# Patient Record
Sex: Female | Born: 1945 | Race: White | Hispanic: No | Marital: Married | Smoking: Former smoker
Health system: Southern US, Community
[De-identification: ages and names within clinical notes are randomized; demographics above are authoritative.]

## PROBLEM LIST (undated history)

## (undated) DIAGNOSIS — K219 Gastro-esophageal reflux disease without esophagitis: Secondary | ICD-10-CM

## (undated) DIAGNOSIS — M1990 Unspecified osteoarthritis, unspecified site: Secondary | ICD-10-CM

## (undated) DIAGNOSIS — C801 Malignant (primary) neoplasm, unspecified: Secondary | ICD-10-CM

## (undated) DIAGNOSIS — E039 Hypothyroidism, unspecified: Secondary | ICD-10-CM

## (undated) HISTORY — PX: TONSILLECTOMY: SUR1361

## (undated) HISTORY — PX: ABDOMINAL HYSTERECTOMY: SHX81

---

## 1997-01-12 HISTORY — PX: BREAST LUMPECTOMY: SHX2

## 1997-11-21 ENCOUNTER — Other Ambulatory Visit: Admission: RE | Admit: 1997-11-21 | Discharge: 1997-11-21 | Payer: Self-pay | Admitting: *Deleted

## 1997-11-28 ENCOUNTER — Ambulatory Visit (HOSPITAL_COMMUNITY): Admission: RE | Admit: 1997-11-28 | Discharge: 1997-11-28 | Payer: Self-pay | Admitting: *Deleted

## 1997-12-14 ENCOUNTER — Encounter: Admission: RE | Admit: 1997-12-14 | Discharge: 1998-03-14 | Payer: Self-pay | Admitting: Radiation Oncology

## 1997-12-18 ENCOUNTER — Ambulatory Visit (HOSPITAL_COMMUNITY): Admission: RE | Admit: 1997-12-18 | Discharge: 1997-12-18 | Payer: Self-pay | Admitting: *Deleted

## 1998-01-01 ENCOUNTER — Ambulatory Visit (HOSPITAL_BASED_OUTPATIENT_CLINIC_OR_DEPARTMENT_OTHER): Admission: RE | Admit: 1998-01-01 | Discharge: 1998-01-01 | Payer: Self-pay | Admitting: *Deleted

## 1998-01-02 ENCOUNTER — Ambulatory Visit (HOSPITAL_COMMUNITY): Admission: RE | Admit: 1998-01-02 | Discharge: 1998-01-02 | Payer: Self-pay | Admitting: Hematology & Oncology

## 1998-01-02 ENCOUNTER — Encounter: Payer: Self-pay | Admitting: Hematology & Oncology

## 1998-01-03 ENCOUNTER — Other Ambulatory Visit: Admission: RE | Admit: 1998-01-03 | Discharge: 1998-01-03 | Payer: Self-pay | Admitting: *Deleted

## 1998-04-15 ENCOUNTER — Encounter: Admission: RE | Admit: 1998-04-15 | Discharge: 1998-07-14 | Payer: Self-pay | Admitting: Radiation Oncology

## 1998-06-21 ENCOUNTER — Ambulatory Visit (HOSPITAL_COMMUNITY): Admission: RE | Admit: 1998-06-21 | Discharge: 1998-06-21 | Payer: Self-pay | Admitting: Hematology & Oncology

## 1998-06-21 ENCOUNTER — Encounter: Payer: Self-pay | Admitting: Hematology & Oncology

## 1998-11-27 ENCOUNTER — Other Ambulatory Visit: Admission: RE | Admit: 1998-11-27 | Discharge: 1998-11-27 | Payer: Self-pay | Admitting: *Deleted

## 1998-11-28 ENCOUNTER — Other Ambulatory Visit: Admission: RE | Admit: 1998-11-28 | Discharge: 1998-11-28 | Payer: Self-pay | Admitting: *Deleted

## 1998-11-28 ENCOUNTER — Encounter (INDEPENDENT_AMBULATORY_CARE_PROVIDER_SITE_OTHER): Payer: Self-pay

## 1999-11-26 ENCOUNTER — Other Ambulatory Visit: Admission: RE | Admit: 1999-11-26 | Discharge: 1999-11-26 | Payer: Self-pay | Admitting: *Deleted

## 2000-11-12 ENCOUNTER — Ambulatory Visit (HOSPITAL_COMMUNITY): Admission: RE | Admit: 2000-11-12 | Discharge: 2000-11-12 | Payer: Self-pay | Admitting: Internal Medicine

## 2000-11-29 ENCOUNTER — Other Ambulatory Visit: Admission: RE | Admit: 2000-11-29 | Discharge: 2000-11-29 | Payer: Self-pay | Admitting: *Deleted

## 2000-11-29 ENCOUNTER — Ambulatory Visit (HOSPITAL_COMMUNITY): Admission: RE | Admit: 2000-11-29 | Discharge: 2000-11-29 | Payer: Self-pay | Admitting: *Deleted

## 2000-12-22 ENCOUNTER — Ambulatory Visit (HOSPITAL_COMMUNITY): Admission: RE | Admit: 2000-12-22 | Discharge: 2000-12-22 | Payer: Self-pay | Admitting: Family Medicine

## 2000-12-22 ENCOUNTER — Encounter: Payer: Self-pay | Admitting: Family Medicine

## 2001-01-18 ENCOUNTER — Ambulatory Visit (HOSPITAL_COMMUNITY): Admission: RE | Admit: 2001-01-18 | Discharge: 2001-01-18 | Payer: Self-pay | Admitting: Internal Medicine

## 2001-01-26 ENCOUNTER — Ambulatory Visit (HOSPITAL_COMMUNITY): Admission: RE | Admit: 2001-01-26 | Discharge: 2001-01-26 | Payer: Self-pay | Admitting: Internal Medicine

## 2001-12-07 ENCOUNTER — Ambulatory Visit (HOSPITAL_COMMUNITY): Admission: RE | Admit: 2001-12-07 | Discharge: 2001-12-07 | Payer: Self-pay | Admitting: *Deleted

## 2001-12-12 ENCOUNTER — Other Ambulatory Visit: Admission: RE | Admit: 2001-12-12 | Discharge: 2001-12-12 | Payer: Self-pay | Admitting: *Deleted

## 2002-12-13 ENCOUNTER — Ambulatory Visit (HOSPITAL_COMMUNITY): Admission: RE | Admit: 2002-12-13 | Discharge: 2002-12-13 | Payer: Self-pay | Admitting: *Deleted

## 2002-12-14 ENCOUNTER — Other Ambulatory Visit: Admission: RE | Admit: 2002-12-14 | Discharge: 2002-12-14 | Payer: Self-pay | Admitting: *Deleted

## 2004-01-09 ENCOUNTER — Ambulatory Visit (HOSPITAL_COMMUNITY): Admission: RE | Admit: 2004-01-09 | Discharge: 2004-01-09 | Payer: Self-pay | Admitting: *Deleted

## 2004-06-03 ENCOUNTER — Ambulatory Visit: Payer: Self-pay | Admitting: Hematology & Oncology

## 2005-01-14 ENCOUNTER — Ambulatory Visit (HOSPITAL_COMMUNITY): Admission: RE | Admit: 2005-01-14 | Discharge: 2005-01-14 | Payer: Self-pay | Admitting: *Deleted

## 2005-01-28 ENCOUNTER — Ambulatory Visit (HOSPITAL_COMMUNITY): Admission: RE | Admit: 2005-01-28 | Discharge: 2005-01-28 | Payer: Self-pay | Admitting: *Deleted

## 2005-04-02 ENCOUNTER — Encounter: Admission: RE | Admit: 2005-04-02 | Discharge: 2005-04-02 | Payer: Self-pay | Admitting: *Deleted

## 2005-04-03 ENCOUNTER — Encounter (INDEPENDENT_AMBULATORY_CARE_PROVIDER_SITE_OTHER): Payer: Self-pay | Admitting: Specialist

## 2005-04-03 ENCOUNTER — Ambulatory Visit (HOSPITAL_BASED_OUTPATIENT_CLINIC_OR_DEPARTMENT_OTHER): Admission: RE | Admit: 2005-04-03 | Discharge: 2005-04-03 | Payer: Self-pay | Admitting: *Deleted

## 2005-06-01 ENCOUNTER — Ambulatory Visit: Payer: Self-pay | Admitting: Hematology & Oncology

## 2005-06-03 LAB — CBC WITH DIFFERENTIAL/PLATELET
BASO%: 0.1 % (ref 0.0–2.0)
HCT: 45.7 % (ref 34.8–46.6)
HGB: 15.8 g/dL (ref 11.6–15.9)
LYMPH%: 31.6 % (ref 14.0–48.0)
MCHC: 34.5 g/dL (ref 32.0–36.0)
MCV: 90.2 fL (ref 81.0–101.0)
MONO%: 7.5 % (ref 0.0–13.0)
NEUT#: 4.7 10*3/uL (ref 1.5–6.5)
NEUT%: 58.8 % (ref 39.6–76.8)
Platelets: 337 10*3/uL (ref 145–400)
WBC: 8 10*3/uL (ref 3.9–10.0)

## 2005-06-03 LAB — COMPREHENSIVE METABOLIC PANEL
Albumin: 4.4 g/dL (ref 3.5–5.2)
Calcium: 9.2 mg/dL (ref 8.4–10.5)
Chloride: 103 mEq/L (ref 96–112)
Creatinine, Ser: 0.7 mg/dL (ref 0.4–1.2)
Glucose, Bld: 95 mg/dL (ref 70–99)
Potassium: 4.4 mEq/L (ref 3.5–5.3)
Sodium: 138 mEq/L (ref 135–145)

## 2005-12-15 ENCOUNTER — Ambulatory Visit (HOSPITAL_COMMUNITY): Admission: RE | Admit: 2005-12-15 | Discharge: 2005-12-16 | Payer: Self-pay | Admitting: *Deleted

## 2005-12-15 ENCOUNTER — Encounter (INDEPENDENT_AMBULATORY_CARE_PROVIDER_SITE_OTHER): Payer: Self-pay | Admitting: *Deleted

## 2006-01-18 ENCOUNTER — Ambulatory Visit (HOSPITAL_COMMUNITY): Admission: RE | Admit: 2006-01-18 | Discharge: 2006-01-18 | Payer: Self-pay | Admitting: *Deleted

## 2006-05-17 ENCOUNTER — Ambulatory Visit: Payer: Self-pay | Admitting: Hematology & Oncology

## 2006-05-19 LAB — COMPREHENSIVE METABOLIC PANEL
Albumin: 4.3 g/dL (ref 3.5–5.2)
BUN: 14 mg/dL (ref 6–23)
CO2: 24 mEq/L (ref 19–32)
Calcium: 8.7 mg/dL (ref 8.4–10.5)
Chloride: 101 mEq/L (ref 96–112)

## 2006-05-19 LAB — CBC WITH DIFFERENTIAL/PLATELET
EOS%: 2.7 % (ref 0.0–7.0)
HCT: 43.2 % (ref 34.8–46.6)
LYMPH%: 29.4 % (ref 14.0–48.0)
MCH: 30.3 pg (ref 26.0–34.0)
MCHC: 34.2 g/dL (ref 32.0–36.0)
MCV: 88.7 fL (ref 81.0–101.0)
MONO%: 6.5 % (ref 0.0–13.0)
NEUT#: 5.7 10*3/uL (ref 1.5–6.5)
NEUT%: 61 % (ref 39.6–76.8)
Platelets: 293 10*3/uL (ref 145–400)
RBC: 4.87 10*6/uL (ref 3.70–5.32)
RDW: 13.1 % (ref 11.3–14.5)

## 2007-03-10 ENCOUNTER — Ambulatory Visit (HOSPITAL_COMMUNITY): Admission: RE | Admit: 2007-03-10 | Discharge: 2007-03-10 | Payer: Self-pay | Admitting: Obstetrics and Gynecology

## 2007-06-02 ENCOUNTER — Ambulatory Visit: Payer: Self-pay | Admitting: Hematology & Oncology

## 2008-03-12 ENCOUNTER — Ambulatory Visit (HOSPITAL_COMMUNITY): Admission: RE | Admit: 2008-03-12 | Discharge: 2008-03-12 | Payer: Self-pay | Admitting: Obstetrics and Gynecology

## 2008-06-04 ENCOUNTER — Ambulatory Visit: Payer: Self-pay | Admitting: Hematology & Oncology

## 2008-06-06 ENCOUNTER — Ambulatory Visit: Payer: Self-pay | Admitting: Diagnostic Radiology

## 2008-06-06 ENCOUNTER — Ambulatory Visit (HOSPITAL_BASED_OUTPATIENT_CLINIC_OR_DEPARTMENT_OTHER): Admission: RE | Admit: 2008-06-06 | Discharge: 2008-06-06 | Payer: Self-pay | Admitting: Hematology & Oncology

## 2008-06-06 LAB — CBC WITH DIFFERENTIAL (CANCER CENTER ONLY)
BASO%: 0.7 % (ref 0.0–2.0)
Eosinophils Absolute: 0.2 10*3/uL (ref 0.0–0.5)
HCT: 45.4 % (ref 34.8–46.6)
LYMPH#: 3.3 10*3/uL (ref 0.9–3.3)
LYMPH%: 41 % (ref 14.0–48.0)
MCH: 30.2 pg (ref 26.0–34.0)
MCHC: 33.8 g/dL (ref 32.0–36.0)
MONO%: 7.2 % (ref 0.0–13.0)
NEUT%: 48.6 % (ref 39.6–80.0)
Platelets: 333 10*3/uL (ref 145–400)
RBC: 5.08 10*6/uL (ref 3.70–5.32)
RDW: 11.8 % (ref 10.5–14.6)

## 2008-06-06 LAB — COMPREHENSIVE METABOLIC PANEL
AST: 19 U/L (ref 0–37)
Albumin: 4.5 g/dL (ref 3.5–5.2)
Alkaline Phosphatase: 111 U/L (ref 39–117)
BUN: 11 mg/dL (ref 6–23)
CO2: 23 mEq/L (ref 19–32)
Chloride: 105 mEq/L (ref 96–112)
Glucose, Bld: 80 mg/dL (ref 70–99)
Total Bilirubin: 0.4 mg/dL (ref 0.3–1.2)
Total Protein: 7.5 g/dL (ref 6.0–8.3)

## 2008-12-12 ENCOUNTER — Ambulatory Visit (HOSPITAL_COMMUNITY): Admission: RE | Admit: 2008-12-12 | Discharge: 2008-12-12 | Payer: Self-pay | Admitting: Family Medicine

## 2009-03-14 ENCOUNTER — Ambulatory Visit (HOSPITAL_COMMUNITY): Admission: RE | Admit: 2009-03-14 | Discharge: 2009-03-14 | Payer: Self-pay | Admitting: Obstetrics and Gynecology

## 2009-05-28 ENCOUNTER — Ambulatory Visit: Payer: Self-pay | Admitting: Hematology & Oncology

## 2009-05-29 LAB — CBC WITH DIFFERENTIAL (CANCER CENTER ONLY)
BASO%: 0.8 % (ref 0.0–2.0)
EOS%: 2.7 % (ref 0.0–7.0)
HGB: 14.6 g/dL (ref 11.6–15.9)
MCH: 29.9 pg (ref 26.0–34.0)
MCV: 90 fL (ref 81–101)
NEUT#: 5.4 10*3/uL (ref 1.5–6.5)
NEUT%: 61.1 % (ref 39.6–80.0)
RBC: 4.88 10*6/uL (ref 3.70–5.32)

## 2009-05-30 LAB — COMPREHENSIVE METABOLIC PANEL
ALT: 18 U/L (ref 0–35)
Albumin: 4.1 g/dL (ref 3.5–5.2)
Alkaline Phosphatase: 108 U/L (ref 39–117)
CO2: 28 mEq/L (ref 19–32)
Glucose, Bld: 80 mg/dL (ref 70–99)

## 2009-05-30 LAB — VITAMIN D 25 HYDROXY (VIT D DEFICIENCY, FRACTURES): Vit D, 25-Hydroxy: 62 ng/mL (ref 30–89)

## 2009-08-30 ENCOUNTER — Encounter (HOSPITAL_COMMUNITY): Admission: RE | Admit: 2009-08-30 | Discharge: 2009-09-29 | Payer: Self-pay | Admitting: Orthopedic Surgery

## 2010-02-02 ENCOUNTER — Encounter: Payer: Self-pay | Admitting: Obstetrics and Gynecology

## 2010-03-10 ENCOUNTER — Other Ambulatory Visit (HOSPITAL_COMMUNITY): Payer: Self-pay | Admitting: Internal Medicine

## 2010-03-10 DIAGNOSIS — Z139 Encounter for screening, unspecified: Secondary | ICD-10-CM

## 2010-03-17 ENCOUNTER — Ambulatory Visit (HOSPITAL_COMMUNITY)
Admission: RE | Admit: 2010-03-17 | Discharge: 2010-03-17 | Disposition: A | Discharge status: Home / Self Care | Payer: Medicare Other | Source: Ambulatory Visit | Attending: Internal Medicine | Admitting: Internal Medicine

## 2010-03-17 DIAGNOSIS — Z139 Encounter for screening, unspecified: Secondary | ICD-10-CM

## 2010-03-17 DIAGNOSIS — Z1231 Encounter for screening mammogram for malignant neoplasm of breast: Secondary | ICD-10-CM | POA: Insufficient documentation

## 2010-05-30 NOTE — Discharge Summary (Signed)
NAMEMORISSA, OBEIRNE             ACCOUNT NO.:  [id number]   MEDICAL RECORD NO.:  [id number]          PATIENT TYPE:  INP   LOCATION:  9316                          FACILITY:  [facility]   PHYSICIAN:  Richardean Sale, M.D.   DATE OF BIRTH:  [birth year]   DATE OF ADMISSION:  12/15/2005  DATE OF DISCHARGE:  12/16/2005                               DISCHARGE SUMMARY   ADMITTING DIAGNOSES:  Complex endometrial hyperplasia.  Symptomatic  cystocele and rectocele -- for hysterectomy.   DISCHARGE DIAGNOSES:  Complex endometrial hyperplasia.  Symptomatic  cystocele and rectocele -- for hysterectomy.   PROCEDURES:  Laparoscopic-assisted vaginal hysterectomy, bilateral  salpingo-oophorectomy, pelvic washings and anterior-posterior repair  performed on December 16, 2005.   OPERATIVE FINDINGS:  Normal-appearing uterus, tubes and ovaries.  Evidence of past bilateral tubal ligation.  Normal-appearing appendix.  Normal liver and gallbladder.  Normal-appearing cervix.  Second-degree  cystocele and third-degree rectocele.   PATHOLOGY:  Cervix showed chronic cervicitis with squamous metaplasia.  Endometrium showed focal complex atypical hyperplasia, with no  malignancy identified.  Benign-appearing ovaries and tubes.  Pelvic  washings negative for malignancy.   LABORATORY STUDIES:  Hemoglobin 12.4, hematocrit 35.8, platelet count  234.   HOSPITAL COURSE:   HISTORY OF PRESENT ILLNESS:  Please see admission history and physical  for details.  Briefly, this is a 65 year old gravida 2, para 2 white  female who was found to have complex atypical endometrial hyperplasia on  D&C.  The patient also complains of symptomatic cystocele and rectocele.  She was admitted on December 15, 2005 and underwent an uncomplicated  laparoscopic-assisted vaginal hysterectomy and bilateral salpingo-  oophorectomy, pelvic washings and anterior-posterior repair.   Postoperatively the patient did well.  She had a low-grade fever  on the  morning of postoperative day #1 of 100.7, which resolved. Pain was  controlled with oral pain medications.  She was tolerating a regular  diet.  She was ambulating and voiding without difficulties and she was  subsequently discharged to home on postoperative day #1 in good  condition.   DISPOSITION:  To home.   CONDITION:  Improved.   MEDICATIONS:  1. Percocet 1-2 tablets p.o. q. 4-6 h. p.r.n. pain.  2. Ibuprofen as needed for pain.  3. Resume home medications.   INSTRUCTIONS:  The patient is to call for fever greater than 100.5,  heavy vaginal bleeding, pain not controlled with pain medications or any  other questions.   FOLLOWUP:  The patient will follow up in 4 weeks for routine  postoperative visit      Richardean Sale, M.D.  Electronically Signed     JW/MEDQ  D:  01/08/2006  T:  01/08/2006  Job:  [id number]

## 2010-05-30 NOTE — Op Note (Signed)
NAMEMCKENSI, REDINGER             ACCOUNT NO.:  [id number]   MEDICAL RECORD NO.:  [id number]          PATIENT TYPE:  INP   LOCATION:  9316                          FACILITY:  [facility]   PHYSICIAN:  Richardean Sale, M.D.   DATE OF BIRTH:  [birth year]   DATE OF PROCEDURE:  12/15/2005  DATE OF DISCHARGE:                               OPERATIVE REPORT   PREOPERATIVE DIAGNOSIS:  Complex endometrial hyperplasia with atypia,  uterine prolapse, symptomatic cystocele and rectocele.   POSTOPERATIVE DIAGNOSIS:  Complex endometrial hyperplasia with atypia,  uterine prolapse, symptomatic cystocele and rectocele.   PROCEDURE:  Laparoscopic assisted vaginal hysterectomy with bilateral  salpingo-oophorectomy, anterior and posterior tear, perineorrhaphy and  pelvic washings.   SURGEON:  Richardean Sale, M.D.   ANESTHESIA:  General.   COMPLICATIONS:  None.   ESTIMATED BLOOD LOSS:  150 mL.   URINE OUTPUT:  550 mL clear.   OPERATIVE FINDINGS:  Normal appearing uterus, tubes and ovaries.  Evidence of past bilateral tubal ligation.  Normal appearing appendix.  Normal appearing liver and gallbladder. Normal appearing cervix. Second  degree cystocele and a third degree rectocele.   SPECIMENS:  Uterus, cervix, fallopian tubes, ovaries and pelvic washings  pathology.   INDICATIONS:  This is a 65 year old white female, gravida 2, para 2, who  underwent a D&C for post menopausal bleeding and was found to have  complex atypical endometrial hyperplasia. The patient presents today for  definitive management with hysterectomy.  In addition, she has  symptomatic uterine prolapse as well as cystocele and rectocele and also  presents for anterior and posterior repair. Prior to the procedure, the  risks, benefits, and alternatives of the procedure were discussed with  the patient in detail.  We discussed the risks, which include but are  not limited to, hemorrhage requiring transfusion, infection, injury to  the bowel, bladder, ureters or other organs which could require  additional surgery, either at the time of this procedure or in the  future.  I discussed the risks of DVT, pulmonary embolus, and other  anesthesia related risks.  The voiced understanding of all the above and  desires to proceed.  Informed was obtained.  All questions were  answered.   DESCRIPTION OF PROCEDURE:  The patient was taken to the operating room  where she was given a general anesthetic.  She was then prepped and  draped in the usual sterile fashion with Betadine and placed in the  dorsal lithotomy position.  A Foley catheter was placed.  A speculum was  placed in the vagina and the cervix was grasped with a single tooth  tenaculum.  The Hulka tenaculum and introduced and the single tooth  tenaculum was removed.  The speculum was removed and attention was then  turned to the patient's abdomen.  6 mL of 0.25% plain Marcaine were  injected into the area where the incision was to be made and a 10 mm  infraumbilical skin incision was made with the scalpel.  This was  carried down to the fascia.  The fascia was then grasped with Kocher  clamps, elevated, and the fascia was  incised with Mayo scissors.  The  peritoneum was identified bluntly.  There was no evidence of any intra-  abdominal adhesions.  The fascial incision was secured with a  pursestring Vicryl suture and the Hassan trocar and sleeve were then  introduced. The camera was then introduced and confirmed intra-abdominal  placement. CO2 gas was then allowed to insufflate.  Two additional ports  were made in the bilateral lower quadrants, again, by injecting 0.25%  Marcaine, approximately 2 mL on each side, then making an incision with  the scalpel and, through this, the 5 mm ports were introduced under  direct visualization.   At this point, pelvic washings were obtained and sent to pathology.  The  pelvis was inspected with the findings noted above.  The  round ligaments  on both sides were then grasped the gyrus cautery, were cauterized,  transected, and were hemostatic.  The infundibulopelvic ligaments on  both sides were identified.  The ureters were identified coursing well  beneath the infundibulopelvic ligament on both sides.  The  infundibulopelvic ligaments were then cauterized and transected with  good hemostasis. The broad ligament was then separated into the anterior  and posterior leaves and the broad ligament was serial grasped,  cauterized and transected, with good hemostasis, down to the level of  the uterine arteries. The bladder flap was then created using the endo-  shears.  The bladder was then displaced inferiorly off of the lower  uterine segment and cervix.  Any areas of bleeding were cauterized. At  this point, all pedicles were inspected and were hemostatic. Attention  was then turned to the vaginal portion of the case.  The instruments  were removed from the patient's abdomen but the trocars remained in  place.   At this point, the Hulka tenaculum was removed and a posterior weighted  speculum was placed in vagina.  The cervix was then grasped with two  Christella Hartigan tenaculum. Approximately 20 mL of dilute vasopressin was then  injected circumferentially around cervix and the Bovie was used then to  make a circumferential incision around the cervix.  The anterior cul-de-  sac was then entered sharply and a beaver retractor was placed in the  anterior cul-de-sac to elevate the bladder.  The posterior cul-de-sac  was also entered sharply without difficulty.  The long narrow weighted  speculum was then inserted in the posterior cul-de-sac. The uterosacral  ligaments on both sides were then clamped, transected, and suture line  with Vicryl suture.  The cardinal ligaments on both sides were then  clamped, transected and suture ligated with Vicryl sutures with excellent hemostasis.  The LigaSure was then used to serially  cauterize  the uterine arteries.  These were then transected with good hemostasis.  Any additional broad ligament and peritoneal surfaces were then clamped  with the LigaSure, cauterized, and transected with good hemostasis.  At  this point, the uterus, fallopian tubes, ovaries, and cervix were  removed and sent to pathology.  The vaginal cuff was then carefully  inspected.   Attention was then turned to the cystocele. An additional 5 mL of  diluted vasopressin were then injected along the anterior vaginal wall.  The vaginal mucosa was then dissected off the bladder using Metzenbaum  scissors.  The edges of the vaginal mucosa were then grasped with Allis  clamps and placed on tension. The cystocele was then reduced and the  pubovesical cervical fascia was identified.  Any areas of bleeding were  cauterized with the  Bovie.  The cystocele was reduced by placing  interrupted 2-0 Vicryl sutures.  The vaginal mucosa was then turned on  both sides and the vaginal mucosa was reapproximated in the midline  using interrupted figure-of-eight Vicryl suture.  At this point, the  anterior portion of the vaginal cuff was closed down to the level of the  uterosacral ligaments using interrupted figure-of-eight Vicryl suture.  The posterior vaginal cuff was noted to be bleeding.  Therefore, the  posterior cuff was made hemostatic with a running locked Vicryl suture.  The edge of the rectum was very close to the posterior vaginal cuff and  great care was taken to make sure that the rectum was not entered.  When  the posterior cuff was hemostatic, the uterosacral ligaments were then  plicated together in the midline with good elevation of the cuff.  The  remaining posterior vaginal cuff was closed with interrupted figure-of-  eight Vicryl sutures and attention was turned the patient's rectocele.   At this point, the edges of the hymenal ring were grasped with Allis  clamps and a wedge shaped portion of  the vaginal mucosa and perineum was  then removed with a scalpel and discarded.  Dilute vasopressin was then  injected along the posterior vaginal wall.  The posterior vaginal mucosa  was then elevated and was dissected off of the rectum with Metzenbaum  scissors.  The edges of the vaginal cuff were grasped with Allis clamps  and placed on tension.  Using both sharp and blunt dissection, the  fascia was then identified. The cystocele was reduced, first by placing  a pursestring Vicryl suture and then by placing interrupted 2-0 Vicryl  sutures to reapproximate the fascia.  Any areas of bleeding were  cauterized with the Bovie.  The vaginal mucosa was then trimmed and the  vaginal mucosa was reapproximated using figure-of-eight Vicryl suture  and was hemostatic.  The perineorrhaphy was then performed. The perineal  body was re-supported with interrupted Vicryl suture and the vaginal mucosa and skin were then closed with subcuticular suture.  At this  point, the vagina was inspected.  There was at two fingerbreadths width  and the area was hemostatic.  The Foley was left in place.   At this point, attention was then turned back to the abdomen. Gloves  were changed.  The laparoscope was inserted and CO2 gas was allowed to  insufflate. The pelvis was then very carefully inspected. All pedicles  in the vaginal cuff were hemostatic.  Both ureters were seen and were  normal caliber and peristalsing normally on both sides.  The Nezhat was  then used to irrigate and suction any remaining fluid and the procedure  was then terminated.  All instruments were removed the patient's  abdomen.  The ports were removed under direct visualization.  CO2 gas  was allowed to escape.  The Roseanne Reno was removed.  The pursestring Vicryl  suture was tied to close the umbilical fascial incision.  The umbilical  incision was then closed with a 4-0 Vicryl subcuticular stitch and the  two 5 mm skin incisions were closed  with Dermabond.   The patient tolerated the procedure very well.  All sponge, lap, needle  and instrument counts were correct x2.  She was taken to recovery room  awake in stable condition.  There were no complications.      Richardean Sale, M.D.  Electronically Signed     JW/MEDQ  D:  12/15/2005  T:  12/15/2005  Job:  [id number]

## 2010-07-09 ENCOUNTER — Encounter (HOSPITAL_BASED_OUTPATIENT_CLINIC_OR_DEPARTMENT_OTHER): Payer: Medicare Other | Admitting: Hematology & Oncology

## 2010-07-09 ENCOUNTER — Other Ambulatory Visit: Payer: Self-pay | Admitting: Hematology & Oncology

## 2010-07-09 DIAGNOSIS — Z853 Personal history of malignant neoplasm of breast: Secondary | ICD-10-CM

## 2010-07-09 DIAGNOSIS — C50919 Malignant neoplasm of unspecified site of unspecified female breast: Secondary | ICD-10-CM

## 2010-07-09 DIAGNOSIS — D649 Anemia, unspecified: Secondary | ICD-10-CM

## 2010-07-09 LAB — COMPREHENSIVE METABOLIC PANEL
AST: 20 U/L (ref 0–37)
CO2: 26 mEq/L (ref 19–32)
Chloride: 103 mEq/L (ref 96–112)
Sodium: 140 mEq/L (ref 135–145)
Total Bilirubin: 0.3 mg/dL (ref 0.3–1.2)
Total Protein: 7.1 g/dL (ref 6.0–8.3)

## 2010-07-09 LAB — CBC WITH DIFFERENTIAL (CANCER CENTER ONLY)
BASO#: 0 10*3/uL (ref 0.0–0.2)
BASO%: 0.3 % (ref 0.0–2.0)
EOS%: 2.7 % (ref 0.0–7.0)
Eosinophils Absolute: 0.3 10*3/uL (ref 0.0–0.5)
LYMPH%: 36.4 % (ref 14.0–48.0)
MCH: 30.5 pg (ref 26.0–34.0)
MONO#: 0.9 10*3/uL (ref 0.1–0.9)
MONO%: 8.8 % (ref 0.0–13.0)
NEUT#: 5.2 10*3/uL (ref 1.5–6.5)
NEUT%: 51.8 % (ref 39.6–80.0)
RBC: 4.95 10*6/uL (ref 3.70–5.32)
RDW: 13.7 % (ref 11.1–15.7)

## 2010-12-12 ENCOUNTER — Telehealth: Payer: Self-pay | Admitting: *Deleted

## 2010-12-12 NOTE — Telephone Encounter (Signed)
Sent pt new schedule moved 6-27 to 6-26

## 2011-03-19 ENCOUNTER — Other Ambulatory Visit: Payer: Self-pay | Admitting: Hematology & Oncology

## 2011-03-19 DIAGNOSIS — Z139 Encounter for screening, unspecified: Secondary | ICD-10-CM

## 2011-03-23 ENCOUNTER — Ambulatory Visit (HOSPITAL_COMMUNITY)
Admission: RE | Admit: 2011-03-23 | Discharge: 2011-03-23 | Disposition: A | Discharge status: Home / Self Care | Payer: Medicare Other | Source: Ambulatory Visit | Attending: Hematology & Oncology | Admitting: Hematology & Oncology

## 2011-03-23 DIAGNOSIS — Z1231 Encounter for screening mammogram for malignant neoplasm of breast: Secondary | ICD-10-CM | POA: Insufficient documentation

## 2011-03-23 DIAGNOSIS — Z139 Encounter for screening, unspecified: Secondary | ICD-10-CM

## 2011-04-08 ENCOUNTER — Telehealth: Payer: Self-pay | Admitting: Hematology & Oncology

## 2011-04-08 NOTE — Telephone Encounter (Signed)
Pt called moved 6-26 to 7-24 wants to see MD only.

## 2011-07-08 ENCOUNTER — Ambulatory Visit: Payer: Medicare Other | Admitting: Hematology & Oncology

## 2011-07-08 ENCOUNTER — Ambulatory Visit: Payer: Medicare Other | Admitting: Family

## 2011-07-08 ENCOUNTER — Other Ambulatory Visit: Payer: Medicare Other | Admitting: Lab

## 2011-08-05 ENCOUNTER — Other Ambulatory Visit (HOSPITAL_BASED_OUTPATIENT_CLINIC_OR_DEPARTMENT_OTHER): Payer: Medicare Other | Admitting: Lab

## 2011-08-05 ENCOUNTER — Ambulatory Visit (HOSPITAL_BASED_OUTPATIENT_CLINIC_OR_DEPARTMENT_OTHER): Payer: Medicare Other | Admitting: Hematology & Oncology

## 2011-08-05 VITALS — BP 130/79 | HR 83 | Temp 97.4°F | Ht 63.0 in | Wt 168.0 lb

## 2011-08-05 DIAGNOSIS — C50919 Malignant neoplasm of unspecified site of unspecified female breast: Secondary | ICD-10-CM

## 2011-08-05 DIAGNOSIS — Z853 Personal history of malignant neoplasm of breast: Secondary | ICD-10-CM

## 2011-08-05 DIAGNOSIS — M810 Age-related osteoporosis without current pathological fracture: Secondary | ICD-10-CM

## 2011-08-05 LAB — CBC WITH DIFFERENTIAL (CANCER CENTER ONLY)
BASO%: 0.3 % (ref 0.0–2.0)
EOS%: 2.6 % (ref 0.0–7.0)
HCT: 44.9 % (ref 34.8–46.6)
LYMPH%: 32.7 % (ref 14.0–48.0)
MCH: 31.9 pg (ref 26.0–34.0)
MCHC: 34.5 g/dL (ref 32.0–36.0)
MCV: 92 fL (ref 81–101)
MONO%: 9.9 % (ref 0.0–13.0)
NEUT%: 54.5 % (ref 39.6–80.0)
Platelets: 264 10*3/uL (ref 145–400)
RDW: 14.1 % (ref 11.1–15.7)
WBC: 8 10*3/uL (ref 3.9–10.0)

## 2011-08-05 NOTE — Progress Notes (Signed)
This office note has been dictated.

## 2011-08-06 LAB — COMPREHENSIVE METABOLIC PANEL
ALT: 21 U/L (ref 0–35)
Alkaline Phosphatase: 93 U/L (ref 39–117)
CO2: 23 mEq/L (ref 19–32)
Creatinine, Ser: 0.6 mg/dL (ref 0.50–1.10)
Sodium: 137 mEq/L (ref 135–145)
Total Bilirubin: 0.4 mg/dL (ref 0.3–1.2)
Total Protein: 7.4 g/dL (ref 6.0–8.3)

## 2011-08-06 LAB — VITAMIN D 25 HYDROXY (VIT D DEFICIENCY, FRACTURES): Vit D, 25-Hydroxy: 84 ng/mL (ref 30–89)

## 2011-08-06 NOTE — Progress Notes (Signed)
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  CURRENT THERAPY:  Observation.  INTERIM HISTORY:  Lauren [name] comes in for her followup.  We see her yearly.  She is doing well.  She just got back from the [place], where they have a house.  There always enjoy down there.  She is enjoying her grandkids.  She has 3 grandkids.  They were with them for 3 weeks down in the [place].  Lauren [name] is ready for a break.  She has had no complaints.  She has had no cough or shortness of breath. There is no headache.  There is no nausea or vomiting.  There is no change in bowel or bladder habits.  She has had no rashes.  There has been no leg swelling.  Her last mammogram was done back in March.  The mammogram looked fine with no evidence of abnormalities or suspicious calcifications.  PHYSICAL EXAMINATION:  General:  This is a well-developed, well- nourished white female in no obvious distress.  Vital Signs:  Show a temperature of 97.4, pulse 83, respiratory rate 18, blood pressure is 130/79, weight is 168.  Head and Neck Exam:  Shows a normocephalic, atraumatic skull.  There are no ocular or oral lesions.  There are no palpable cervical or supraclavicular lymph nodes.  Lungs:  Clear bilaterally.  Cardiac Exam:  Regular rate and rhythm with a normal S1 and S2.  There are no murmurs, rubs, or bruits.  Breast Exam:  Shows the right breast with no masses, edema, or erythema.  There is no right axillary adenopathy.  Left breast shows a well-healed lumpectomy at the 2 o'clock position.  There is some slight firmness at the lumpectomy site.  No distinct mass noted in the left breast.  There is no left axillary adenopathy.  Abdominal Exam:  Soft with good bowel sounds. There is no palpable abdominal mass.  There is no fluid wave.  There is no palpable hepatosplenomegaly.  Back Exam:  No tenderness over the spine, ribs, or hips.  Extremities:   Show no clubbing, cyanosis, or edema.  Neurological Exam:  Shows no focal neurological deficits.  LABORATORY STUDIES:  White cell count is 8, hemoglobin 15.5, hematocrit 44.9, platelet count 264.  BUN is 18, creatinine 0.6.  Calcium 9.9 with an albumin of 4.6.  LFTs are normal.  IMPRESSION:  Lauren [name] is a 66 year old white female with a remote history of stage I lobular carcinoma of the left breast.  She underwent lumpectomy back in 1999.  She was on tamoxifen for 5 years.  We will see her back in 1 year.  She has really done very well.  I do believe that she is cured.    ______________________________ Josph Macho, M.D. PRE/MEDQ  D:  08/05/2011  T:  08/06/2011  Job:  [id number]

## 2011-08-13 ENCOUNTER — Telehealth: Payer: Self-pay | Admitting: *Deleted

## 2011-08-13 NOTE — Telephone Encounter (Signed)
Called patient to let her know that her labwork and vitamin D. Were great per dr. Myna Hidalgo

## 2011-08-13 NOTE — Telephone Encounter (Signed)
Message copied by Anselm Jungling on Thu Aug 13, 2011 10:29 AM ------      Message from: Arlan Organ R      Created: Mon Aug 10, 2011  5:28 PM       Call - labs and vit D are doing great!!!  Enjoy the [place]!!!  Cindee Lame

## 2012-03-17 ENCOUNTER — Other Ambulatory Visit: Payer: Self-pay | Admitting: Hematology & Oncology

## 2012-03-17 DIAGNOSIS — Z139 Encounter for screening, unspecified: Secondary | ICD-10-CM

## 2012-03-28 ENCOUNTER — Ambulatory Visit (HOSPITAL_COMMUNITY)
Admission: RE | Admit: 2012-03-28 | Discharge: 2012-03-28 | Disposition: A | Discharge status: Home / Self Care | Payer: Medicare Other | Source: Ambulatory Visit | Attending: Hematology & Oncology | Admitting: Hematology & Oncology

## 2012-03-28 DIAGNOSIS — Z1231 Encounter for screening mammogram for malignant neoplasm of breast: Secondary | ICD-10-CM | POA: Insufficient documentation

## 2012-08-04 ENCOUNTER — Ambulatory Visit (HOSPITAL_BASED_OUTPATIENT_CLINIC_OR_DEPARTMENT_OTHER)
Admission: RE | Admit: 2012-08-04 | Discharge: 2012-08-04 | Disposition: A | Discharge status: Home / Self Care | Payer: Medicare Other | Source: Ambulatory Visit | Attending: Hematology & Oncology | Admitting: Hematology & Oncology

## 2012-08-04 ENCOUNTER — Other Ambulatory Visit (HOSPITAL_BASED_OUTPATIENT_CLINIC_OR_DEPARTMENT_OTHER): Payer: Medicare Other | Admitting: Lab

## 2012-08-04 ENCOUNTER — Ambulatory Visit (HOSPITAL_BASED_OUTPATIENT_CLINIC_OR_DEPARTMENT_OTHER): Payer: Medicare Other | Admitting: Hematology & Oncology

## 2012-08-04 VITALS — BP 124/75 | HR 79 | Temp 98.2°F | Resp 16 | Ht 63.0 in | Wt 167.0 lb

## 2012-08-04 DIAGNOSIS — M810 Age-related osteoporosis without current pathological fracture: Secondary | ICD-10-CM

## 2012-08-04 DIAGNOSIS — C50919 Malignant neoplasm of unspecified site of unspecified female breast: Secondary | ICD-10-CM

## 2012-08-04 DIAGNOSIS — R059 Cough, unspecified: Secondary | ICD-10-CM | POA: Insufficient documentation

## 2012-08-04 DIAGNOSIS — R05 Cough: Secondary | ICD-10-CM | POA: Insufficient documentation

## 2012-08-04 DIAGNOSIS — D689 Coagulation defect, unspecified: Secondary | ICD-10-CM

## 2012-08-04 LAB — CBC WITH DIFFERENTIAL (CANCER CENTER ONLY)
BASO%: 0.6 % (ref 0.0–2.0)
EOS%: 3.6 % (ref 0.0–7.0)
HCT: 44.9 % (ref 34.8–46.6)
LYMPH#: 2.7 10*3/uL (ref 0.9–3.3)
LYMPH%: 37.6 % (ref 14.0–48.0)
MCHC: 33.2 g/dL (ref 32.0–36.0)
MCV: 92 fL (ref 81–101)
NEUT%: 46.4 % (ref 39.6–80.0)
RDW: 13.4 % (ref 11.1–15.7)

## 2012-08-04 NOTE — Progress Notes (Signed)
This office note has been dictated.

## 2012-08-05 ENCOUNTER — Telehealth: Payer: Self-pay | Admitting: *Deleted

## 2012-08-05 LAB — COMPREHENSIVE METABOLIC PANEL
ALT: 21 U/L (ref 0–35)
AST: 19 U/L (ref 0–37)
Alkaline Phosphatase: 103 U/L (ref 39–117)
Creatinine, Ser: 0.64 mg/dL (ref 0.50–1.10)
Total Bilirubin: 0.4 mg/dL (ref 0.3–1.2)

## 2012-08-05 NOTE — Telephone Encounter (Signed)
Called patient to let her know that her labs and chest xray were ok per dr. Myna Hidalgo

## 2012-08-05 NOTE — Progress Notes (Signed)
CC:   Lauren [name]. 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  CURRENT THERAPY:  Observation.  INTERIM HISTORY:  Lauren [name] comes in for followup.  She is doing quite well.  As always, she practically lives down in the [place].  She just got back this past weekend.  She will be heading back down in September.  She has had no problems this year.  She has had no broken bones.  She has had no nausea or vomiting.  There has been no change in bowel or bladder habits.  We will go and get a chest x-ray on her today.  Her last mammogram was done back in February.  Everything looked good on the mammogram.  Actually the mammogram was done in March.  PHYSICAL EXAMINATION:  General:  This is a well-developed, well- nourished white female in no obvious distress.  Vital signs: Temperature of 98.2, pulse 79, respiratory rate 16, blood pressure 124/75.  Weight is 167.  Head and neck:  Normocephalic, atraumatic skull.  There are no ocular or oral lesions.  There are no palpable cervical or supraclavicular lymph nodes.  Lungs:  Clear bilaterally. Cardiac:  Regular rate and rhythm with a normal S1 and S2.  There are no murmurs, rubs or bruits.  Breasts:  Shows right breast with no masses, edema or erythema.  There is no nipple discharge.  There is no right axillary adenopathy.  Left breast shows well-healed lumpectomy at the 2 o'clock position.  No masses noted in the left breast.  There is no left axillary lymphadenopathy.  Abdomen:  Soft.  She has good bowel sounds. There is no palpable abdominal mass.  No palpable hepatosplenomegaly. Extremities:  Show no clubbing, cyanosis or edema.  Skin:  Shows no rashes, ecchymosis, or petechia.  Neurological:  Shows no focal neurological deficits.  LABORATORY STUDIES:  White cell count is 7.3, hemoglobin 14.9, hematocrit 44.9, platelet count 275.  IMPRESSION:  Lauren [name] is a 67 year old white female with history  of lobular carcinoma of the left breast.  She had stage I disease.  She underwent lumpectomy and radiation therapy.  She completed 5 years of tamoxifen back in 2004.  We will continue to see her yearly.  We will get a chest x-ray on her today.    ______________________________ Lauren [name], M.D. PRE/MEDQ  D:  08/04/2012  T:  08/05/2012  Job:  [id number]

## 2012-08-05 NOTE — Telephone Encounter (Signed)
Message copied by Anselm Jungling on Fri Aug 05, 2012  5:02 PM ------      Message from: Arlan Organ R      Created: Thu Aug 04, 2012  9:23 PM       Call - labs and chest xray are ok!!  Cindee Lame ------

## 2013-03-15 ENCOUNTER — Other Ambulatory Visit (HOSPITAL_COMMUNITY): Payer: Self-pay | Admitting: Internal Medicine

## 2013-03-15 DIAGNOSIS — Z1231 Encounter for screening mammogram for malignant neoplasm of breast: Secondary | ICD-10-CM

## 2013-03-30 ENCOUNTER — Ambulatory Visit (HOSPITAL_COMMUNITY)
Admission: RE | Admit: 2013-03-30 | Discharge: 2013-03-30 | Disposition: A | Discharge status: Home / Self Care | Payer: Medicare Other | Source: Ambulatory Visit | Attending: Internal Medicine | Admitting: Internal Medicine

## 2013-03-30 DIAGNOSIS — Z1231 Encounter for screening mammogram for malignant neoplasm of breast: Secondary | ICD-10-CM | POA: Insufficient documentation

## 2013-06-12 ENCOUNTER — Telehealth: Payer: Self-pay | Admitting: Hematology & Oncology

## 2013-06-12 NOTE — Telephone Encounter (Signed)
Pt called moved 7-23 to 8-18

## 2013-08-03 ENCOUNTER — Other Ambulatory Visit: Payer: Medicare Other | Admitting: Lab

## 2013-08-03 ENCOUNTER — Ambulatory Visit: Payer: Medicare Other | Admitting: Hematology & Oncology

## 2013-08-28 ENCOUNTER — Other Ambulatory Visit: Payer: Self-pay

## 2013-08-28 DIAGNOSIS — Z853 Personal history of malignant neoplasm of breast: Secondary | ICD-10-CM

## 2013-08-29 ENCOUNTER — Other Ambulatory Visit (HOSPITAL_BASED_OUTPATIENT_CLINIC_OR_DEPARTMENT_OTHER): Payer: Medicare Other | Admitting: Lab

## 2013-08-29 ENCOUNTER — Ambulatory Visit (HOSPITAL_BASED_OUTPATIENT_CLINIC_OR_DEPARTMENT_OTHER): Payer: Medicare Other | Admitting: Hematology & Oncology

## 2013-08-29 VITALS — BP 126/82 | HR 84 | Temp 96.8°F | Resp 18 | Wt 160.0 lb

## 2013-08-29 DIAGNOSIS — C50912 Malignant neoplasm of unspecified site of left female breast: Secondary | ICD-10-CM

## 2013-08-29 DIAGNOSIS — Z853 Personal history of malignant neoplasm of breast: Secondary | ICD-10-CM

## 2013-08-29 LAB — CBC WITH DIFFERENTIAL (CANCER CENTER ONLY)
BASO#: 0 10*3/uL (ref 0.0–0.2)
BASO%: 0.4 % (ref 0.0–2.0)
EOS%: 4.7 % (ref 0.0–7.0)
Eosinophils Absolute: 0.3 10*3/uL (ref 0.0–0.5)
HEMATOCRIT: 43.5 % (ref 34.8–46.6)
HEMOGLOBIN: 14.6 g/dL (ref 11.6–15.9)
LYMPH#: 2 10*3/uL (ref 0.9–3.3)
LYMPH%: 28 % (ref 14.0–48.0)
MCH: 30.9 pg (ref 26.0–34.0)
MCHC: 33.6 g/dL (ref 32.0–36.0)
MCV: 92 fL (ref 81–101)
MONO#: 0.8 10*3/uL (ref 0.1–0.9)
MONO%: 11.1 % (ref 0.0–13.0)
NEUT#: 4 10*3/uL (ref 1.5–6.5)
NEUT%: 55.8 % (ref 39.6–80.0)
Platelets: 282 10*3/uL (ref 145–400)
RBC: 4.73 10*6/uL (ref 3.70–5.32)
RDW: 14.1 % (ref 11.1–15.7)
WBC: 7.2 10*3/uL (ref 3.9–10.0)

## 2013-08-30 LAB — COMPREHENSIVE METABOLIC PANEL
ALT: 18 U/L (ref 0–35)
AST: 17 U/L (ref 0–37)
Albumin: 4.3 g/dL (ref 3.5–5.2)
Alkaline Phosphatase: 115 U/L (ref 39–117)
BUN: 11 mg/dL (ref 6–23)
CALCIUM: 9 mg/dL (ref 8.4–10.5)
CHLORIDE: 102 meq/L (ref 96–112)
CO2: 27 mEq/L (ref 19–32)
CREATININE: 0.55 mg/dL (ref 0.50–1.10)
Glucose, Bld: 93 mg/dL (ref 70–99)
Potassium: 4.2 mEq/L (ref 3.5–5.3)
Sodium: 137 mEq/L (ref 135–145)
Total Bilirubin: 0.5 mg/dL (ref 0.2–1.2)
Total Protein: 7.3 g/dL (ref 6.0–8.3)

## 2013-08-30 LAB — VITAMIN D 25 HYDROXY (VIT D DEFICIENCY, FRACTURES): Vit D, 25-Hydroxy: 86 ng/mL (ref 30–89)

## 2013-08-30 NOTE — Progress Notes (Signed)
Hematology and Oncology Follow Up Visit  Lauren [name] [id number] May 24, 1945 68 y.o. 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  Current Therapy:   Observation     Interim History:  Ms.  Lauren [name] is back for followup peewee see her yearly. As always, she just got back from the [place].  She's been doing well this year. She's had no problems. She's had no change in medications. She's had no cough. She's had no nausea or vomiting his been no change in bowel or bladder habits. She's had no problems with weight loss weight gain.  She has occasional bouts of arthralgia.  Her last mammogram was back in March of this year. Everything looked fine..  Medications: Current outpatient prescriptions:aspirin 81 MG tablet, Take 81 mg by mouth daily., Disp: , Rfl: ;  Calcium-Vitamin D-Vitamin K (VIACTIV PO), Take by mouth. Only with vitamin D, Disp: , Rfl: ;  celecoxib (CELEBREX) 200 MG capsule, Take 200 mg by mouth 2 (two) times daily., Disp: , Rfl: ;  folic acid (FOLVITE) 0.5 MG tablet, Take 1 mg by mouth daily., Disp: , Rfl:  glucosamine-chondroitin 500-400 MG tablet, Take 1 tablet by mouth every morning., Disp: , Rfl: ;  levothyroxine (SYNTHROID) 150 MCG tablet, Take 150 mcg by mouth daily., Disp: , Rfl: ;  Magnesium 400 MG CAPS, Take by mouth every morning., Disp: , Rfl: ;  methotrexate (RHEUMATREX) 2.5 MG tablet, Take 2.5 mg by mouth once a week. Caution:Chemotherapy. Protect from light.  Takes 8 once a week., Disp: , Rfl:  pravastatin (PRAVACHOL) 10 MG tablet, 10 mg daily. , Disp: , Rfl:   Allergies: No Known Allergies  Past Medical History, Surgical history, Social history, and Family History were reviewed and updated.  Review of Systems: As above  Physical Exam:  weight is 160 lb (72.576 kg). Her temperature is 96.8 F (36 C). Her blood pressure is 126/82 and her pulse is 84. Her respiration is 18.   Well-developed and well-nourished white  female. Head and neck exam shows no ocular or oral lesions. There are no palpable cervical or supraclavicular lymph nodes. Lungs are clear. Cardiac exam regular in rhythm with no murmurs rubs or bruits. Breast exam shows right breast with no masses, edema or erythema. There is no right axillary adenopathy. Left breast shows a well-healed lumpectomy at the 2:00 position. No masses noted in the left breast. There is no erythema or swelling. There is no left nipple discharge. There is no left axillary lymphadenopathy. Abdomen is soft. She has good bowel sounds. There is no fluid wave. There is no palpable liver or spleen tip. Back exam shows no tenderness over the spine ribs or hips. Extremities shows no clubbing cyanosis or edema. Skin exam shows no suspicious rashes.  Lab Results  Component Value Date   WBC 7.2 08/29/2013   HGB 14.6 08/29/2013   HCT 43.5 08/29/2013   MCV 92 08/29/2013   PLT 282 08/29/2013     Chemistry      Component Value Date/Time   NA 137 08/29/2013 1034   K 4.2 08/29/2013 1034   CL 102 08/29/2013 1034   CO2 27 08/29/2013 1034   BUN 11 08/29/2013 1034   CREATININE 0.55 08/29/2013 1034      Component Value Date/Time   CALCIUM 9.0 08/29/2013 1034   ALKPHOS 115 08/29/2013 1034   AST 17 08/29/2013 1034   ALT 18 08/29/2013 1034   BILITOT 0.5 08/29/2013  1034         Impression and Plan: Ms. Lauren [name] is 68 year old white female with a past history of stage I infiltrating lobular carcinoma of the left breast. She underwent lumpectomy and radiation. She had tamoxifen. This was completed back in 2004.  We will continue to see her back yearly. She enjoys seeing [place] once a year.   Lauren Napoleon, MD 8/19/20156:14 PM

## 2013-08-31 ENCOUNTER — Telehealth: Payer: Self-pay | Admitting: *Deleted

## 2013-08-31 NOTE — Telephone Encounter (Signed)
Message copied by Rico Ala on Thu Aug 31, 2013 10:50 AM ------      Message from: Burney Gauze R      Created: Wed Aug 30, 2013  7:22 AM       Call - labs and vit D are ok.  pete ------

## 2013-12-25 ENCOUNTER — Other Ambulatory Visit (HOSPITAL_COMMUNITY): Payer: Self-pay | Admitting: Internal Medicine

## 2013-12-25 DIAGNOSIS — E039 Hypothyroidism, unspecified: Secondary | ICD-10-CM

## 2013-12-25 DIAGNOSIS — Z139 Encounter for screening, unspecified: Secondary | ICD-10-CM

## 2014-01-01 ENCOUNTER — Ambulatory Visit (HOSPITAL_COMMUNITY)
Admission: RE | Admit: 2014-01-01 | Discharge: 2014-01-01 | Disposition: A | Discharge status: Home / Self Care | Payer: Medicare Other | Source: Ambulatory Visit | Attending: Internal Medicine | Admitting: Internal Medicine

## 2014-01-01 DIAGNOSIS — Z13820 Encounter for screening for osteoporosis: Secondary | ICD-10-CM | POA: Insufficient documentation

## 2014-01-01 DIAGNOSIS — E039 Hypothyroidism, unspecified: Secondary | ICD-10-CM

## 2014-04-20 DIAGNOSIS — H04123 Dry eye syndrome of bilateral lacrimal glands: Secondary | ICD-10-CM | POA: Diagnosis not present

## 2014-04-20 DIAGNOSIS — H3589 Other specified retinal disorders: Secondary | ICD-10-CM | POA: Diagnosis not present

## 2014-04-20 DIAGNOSIS — H31092 Other chorioretinal scars, left eye: Secondary | ICD-10-CM | POA: Diagnosis not present

## 2014-04-20 DIAGNOSIS — H40013 Open angle with borderline findings, low risk, bilateral: Secondary | ICD-10-CM | POA: Diagnosis not present

## 2014-04-20 DIAGNOSIS — H2513 Age-related nuclear cataract, bilateral: Secondary | ICD-10-CM | POA: Diagnosis not present

## 2014-04-23 ENCOUNTER — Other Ambulatory Visit (HOSPITAL_COMMUNITY): Payer: Self-pay | Admitting: Internal Medicine

## 2014-04-23 DIAGNOSIS — L4050 Arthropathic psoriasis, unspecified: Secondary | ICD-10-CM | POA: Diagnosis not present

## 2014-04-23 DIAGNOSIS — L401 Generalized pustular psoriasis: Secondary | ICD-10-CM | POA: Diagnosis not present

## 2014-04-23 DIAGNOSIS — Z1231 Encounter for screening mammogram for malignant neoplasm of breast: Secondary | ICD-10-CM

## 2014-04-30 DIAGNOSIS — Z23 Encounter for immunization: Secondary | ICD-10-CM | POA: Diagnosis not present

## 2014-05-14 DIAGNOSIS — H2511 Age-related nuclear cataract, right eye: Secondary | ICD-10-CM | POA: Diagnosis not present

## 2014-05-21 ENCOUNTER — Ambulatory Visit (HOSPITAL_COMMUNITY)
Admission: RE | Admit: 2014-05-21 | Discharge: 2014-05-21 | Disposition: A | Discharge status: Home / Self Care | Payer: Medicare Other | Source: Ambulatory Visit | Attending: Internal Medicine | Admitting: Internal Medicine

## 2014-05-21 DIAGNOSIS — Z1231 Encounter for screening mammogram for malignant neoplasm of breast: Secondary | ICD-10-CM | POA: Insufficient documentation

## 2014-05-22 ENCOUNTER — Telehealth: Payer: Self-pay | Admitting: *Deleted

## 2014-05-22 DIAGNOSIS — H2512 Age-related nuclear cataract, left eye: Secondary | ICD-10-CM | POA: Diagnosis not present

## 2014-05-22 NOTE — Telephone Encounter (Signed)
Patient wants Dr Antonieta Pert opinion on her rheumatologist prescribing Humira. Dr Marin Olp has no concerns. Communicated this to patient.

## 2014-05-28 DIAGNOSIS — H2512 Age-related nuclear cataract, left eye: Secondary | ICD-10-CM | POA: Diagnosis not present

## 2014-06-08 DIAGNOSIS — L401 Generalized pustular psoriasis: Secondary | ICD-10-CM | POA: Diagnosis not present

## 2014-06-08 DIAGNOSIS — L4050 Arthropathic psoriasis, unspecified: Secondary | ICD-10-CM | POA: Diagnosis not present

## 2014-08-28 ENCOUNTER — Other Ambulatory Visit (HOSPITAL_BASED_OUTPATIENT_CLINIC_OR_DEPARTMENT_OTHER): Payer: Medicare Other

## 2014-08-28 ENCOUNTER — Ambulatory Visit (HOSPITAL_BASED_OUTPATIENT_CLINIC_OR_DEPARTMENT_OTHER): Payer: Medicare Other | Admitting: Hematology & Oncology

## 2014-08-28 ENCOUNTER — Encounter: Payer: Self-pay | Admitting: Hematology & Oncology

## 2014-08-28 VITALS — BP 125/74 | HR 82 | Temp 97.5°F | Resp 16 | Ht 63.0 in | Wt 165.0 lb

## 2014-08-28 DIAGNOSIS — C50912 Malignant neoplasm of unspecified site of left female breast: Secondary | ICD-10-CM | POA: Diagnosis not present

## 2014-08-28 DIAGNOSIS — Z853 Personal history of malignant neoplasm of breast: Secondary | ICD-10-CM | POA: Diagnosis not present

## 2014-08-28 DIAGNOSIS — D688 Other specified coagulation defects: Secondary | ICD-10-CM | POA: Diagnosis not present

## 2014-08-28 LAB — CBC WITH DIFFERENTIAL (CANCER CENTER ONLY)
BASO#: 0 10*3/uL (ref 0.0–0.2)
BASO%: 0.3 % (ref 0.0–2.0)
EOS ABS: 0.3 10*3/uL (ref 0.0–0.5)
EOS%: 3.5 % (ref 0.0–7.0)
HCT: 41 % (ref 34.8–46.6)
HEMOGLOBIN: 13.8 g/dL (ref 11.6–15.9)
LYMPH#: 1.8 10*3/uL (ref 0.9–3.3)
LYMPH%: 22.9 % (ref 14.0–48.0)
MCH: 31.2 pg (ref 26.0–34.0)
MCHC: 33.7 g/dL (ref 32.0–36.0)
MCV: 93 fL (ref 81–101)
MONO#: 1.1 10*3/uL — AB (ref 0.1–0.9)
MONO%: 14 % — AB (ref 0.0–13.0)
NEUT%: 59.3 % (ref 39.6–80.0)
NEUTROS ABS: 4.6 10*3/uL (ref 1.5–6.5)
PLATELETS: 266 10*3/uL (ref 145–400)
RBC: 4.42 10*6/uL (ref 3.70–5.32)
RDW: 14.2 % (ref 11.1–15.7)
WBC: 7.7 10*3/uL (ref 3.9–10.0)

## 2014-08-28 NOTE — Progress Notes (Signed)
Hematology and Oncology Follow Up Visit  Lauren [name] [id number] Apr 14, 1945 69 y.o. 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  Current Therapy:   Observation     Interim History:  Ms.  Lauren [name] is back for followup peewee see her yearly. As always, she just got back from the [place].  She's been doing well this year. She's had no problems. She's had no change in medications. She's had no cough. She's had no nausea or vomiting his been no change in bowel or bladder habits. She's had no problems with weight loss weight gain.  She has occasional bouts of arthralgia.  She does have some arthritis. She has psoriatic arthritis  Her last mammogram was back in  May of this year. Everything looked fine..   I told her to take extra viamin D. I toldher to ae 1000 units daly along with calcium  Medications:  Current outpatient prescriptions:  .  aspirin 81 MG tablet, Take 81 mg by mouth daily., Disp: , Rfl:  .  Calcium-Vitamin D-Vitamin K (VIACTIV PO), Take by mouth. Only with vitamin D, Disp: , Rfl:  .  celecoxib (CELEBREX) 200 MG capsule, Take 200 mg by mouth 2 (two) times daily., Disp: , Rfl:  .  folic acid (FOLVITE) 0.5 MG tablet, Take 1 mg by mouth daily., Disp: , Rfl:  .  glucosamine-chondroitin 500-400 MG tablet, Take 1 tablet by mouth every morning., Disp: , Rfl:  .  Magnesium 400 MG CAPS, Take by mouth every morning., Disp: , Rfl:  .  methotrexate (RHEUMATREX) 2.5 MG tablet, Take 250 mg by mouth once a week. Caution:Chemotherapy. Protect from light.  Takes 8 once a week., Disp: , Rfl:  .  pravastatin (PRAVACHOL) 10 MG tablet, 10 mg daily. , Disp: , Rfl:  .  SYNTHROID 137 MCG tablet, , Disp: , Rfl:   Allergies: No Known Allergies  Past Medical History, Surgical history, Social history, and Family History were reviewed and updated.  Review of Systems: As above  Physical Exam:  height is 5\' 3"  (1.6 m) and weight is 165 lb (74.844  kg). Her oral temperature is 97.5 F (36.4 C). Her blood pressure is 125/74 and her pulse is 82. Her respiration is 16.   Well-developed and well-nourished white female. Head and neck exam shows no ocular or oral lesions. There are no palpable cervical or supraclavicular lymph nodes. Lungs are clear. Cardiac exam regular rate andrhythm with no murmurs rubs or bruits. Breast exam shows right breast with no masses, edema or erythema. There is no right axillary adenopathy. Left breast shows a well-healed lumpectomy at the 2:00 position. No masses noted in the left breast. There is no erythema or swelling. There is no left nipple discharge. There is no left axillary lymphadenopathy. Abdomen is soft. She has good bowel sounds. There is no fluid wave. There is no palpable liver or spleen tip. Back exam shows no tenderness over the spine ribs or hips. Extremities shows no clubbing cyanosis or edema. Skin exam shows no suspicious rashes.  Lab Results  Component Value Date   WBC 7.7 08/28/2014   HGB 13.8 08/28/2014   HCT 41.0 08/28/2014   MCV 93 08/28/2014   PLT 266 08/28/2014     Chemistry      Component Value Date/Time   NA 137 08/29/2013 1034   K 4.2 08/29/2013 1034   CL 102 08/29/2013 1034   CO2 27 08/29/2013 1034  BUN 11 08/29/2013 1034   CREATININE 0.55 08/29/2013 1034      Component Value Date/Time   CALCIUM 9.0 08/29/2013 1034   ALKPHOS 115 08/29/2013 1034   AST 17 08/29/2013 1034   ALT 18 08/29/2013 1034   BILITOT 0.5 08/29/2013 1034         Impression and Plan: Ms. Lauren [name] is 69 year old white female with a past history of stage I infiltrating lobular carcinoma of the left breast. She underwent lumpectomy and radiation. She had tamoxifen. This was completed back in 2004.  We will continue to see her back yearly. She enjoys seeing [place] once a year.   [name], MD 8/16/201610:33 AM

## 2014-08-29 LAB — COMPREHENSIVE METABOLIC PANEL
ALK PHOS: 106 U/L (ref 33–130)
ALT: 18 U/L (ref 6–29)
AST: 18 U/L (ref 10–35)
Albumin: 3.8 g/dL (ref 3.6–5.1)
BILIRUBIN TOTAL: 0.5 mg/dL (ref 0.2–1.2)
BUN: 20 mg/dL (ref 7–25)
CO2: 26 mmol/L (ref 20–31)
Calcium: 9.2 mg/dL (ref 8.6–10.4)
Chloride: 102 mmol/L (ref 98–110)
Creatinine, Ser: 0.53 mg/dL (ref 0.50–0.99)
GLUCOSE: 66 mg/dL (ref 65–99)
POTASSIUM: 5.3 mmol/L (ref 3.5–5.3)
Sodium: 139 mmol/L (ref 135–146)
Total Protein: 6.8 g/dL (ref 6.1–8.1)

## 2014-08-29 LAB — VITAMIN D 25 HYDROXY (VIT D DEFICIENCY, FRACTURES): VIT D 25 HYDROXY: 59 ng/mL (ref 30–100)

## 2014-09-03 DIAGNOSIS — M150 Primary generalized (osteo)arthritis: Secondary | ICD-10-CM | POA: Diagnosis not present

## 2014-09-03 DIAGNOSIS — L4050 Arthropathic psoriasis, unspecified: Secondary | ICD-10-CM | POA: Diagnosis not present

## 2014-09-03 DIAGNOSIS — L401 Generalized pustular psoriasis: Secondary | ICD-10-CM | POA: Diagnosis not present

## 2014-09-06 DIAGNOSIS — L400 Psoriasis vulgaris: Secondary | ICD-10-CM | POA: Diagnosis not present

## 2014-09-06 DIAGNOSIS — Z1283 Encounter for screening for malignant neoplasm of skin: Secondary | ICD-10-CM | POA: Diagnosis not present

## 2014-12-03 DIAGNOSIS — M150 Primary generalized (osteo)arthritis: Secondary | ICD-10-CM | POA: Diagnosis not present

## 2014-12-03 DIAGNOSIS — L4050 Arthropathic psoriasis, unspecified: Secondary | ICD-10-CM | POA: Diagnosis not present

## 2014-12-03 DIAGNOSIS — L401 Generalized pustular psoriasis: Secondary | ICD-10-CM | POA: Diagnosis not present

## 2014-12-31 DIAGNOSIS — L4050 Arthropathic psoriasis, unspecified: Secondary | ICD-10-CM | POA: Diagnosis not present

## 2015-01-13 HISTORY — PX: EYE SURGERY: SHX253

## 2015-04-22 ENCOUNTER — Other Ambulatory Visit: Payer: Self-pay | Admitting: Hematology & Oncology

## 2015-04-22 DIAGNOSIS — Z1231 Encounter for screening mammogram for malignant neoplasm of breast: Secondary | ICD-10-CM

## 2015-05-14 DIAGNOSIS — M150 Primary generalized (osteo)arthritis: Secondary | ICD-10-CM | POA: Diagnosis not present

## 2015-05-14 DIAGNOSIS — L401 Generalized pustular psoriasis: Secondary | ICD-10-CM | POA: Diagnosis not present

## 2015-05-14 DIAGNOSIS — L4050 Arthropathic psoriasis, unspecified: Secondary | ICD-10-CM | POA: Diagnosis not present

## 2015-05-27 ENCOUNTER — Ambulatory Visit (HOSPITAL_COMMUNITY)
Admission: RE | Admit: 2015-05-27 | Discharge: 2015-05-27 | Disposition: A | Discharge status: Home / Self Care | Payer: Medicare Other | Source: Ambulatory Visit | Attending: Hematology & Oncology | Admitting: Hematology & Oncology

## 2015-05-27 DIAGNOSIS — R928 Other abnormal and inconclusive findings on diagnostic imaging of breast: Secondary | ICD-10-CM | POA: Diagnosis not present

## 2015-05-27 DIAGNOSIS — Z1231 Encounter for screening mammogram for malignant neoplasm of breast: Secondary | ICD-10-CM | POA: Insufficient documentation

## 2015-05-29 ENCOUNTER — Other Ambulatory Visit: Payer: Self-pay | Admitting: Hematology & Oncology

## 2015-05-29 DIAGNOSIS — R928 Other abnormal and inconclusive findings on diagnostic imaging of breast: Secondary | ICD-10-CM

## 2015-08-29 ENCOUNTER — Other Ambulatory Visit: Payer: Self-pay | Admitting: Nurse Practitioner

## 2015-08-29 DIAGNOSIS — E559 Vitamin D deficiency, unspecified: Secondary | ICD-10-CM

## 2015-08-29 DIAGNOSIS — C50912 Malignant neoplasm of unspecified site of left female breast: Secondary | ICD-10-CM

## 2015-08-30 ENCOUNTER — Other Ambulatory Visit (HOSPITAL_BASED_OUTPATIENT_CLINIC_OR_DEPARTMENT_OTHER): Payer: Medicare Other

## 2015-08-30 ENCOUNTER — Ambulatory Visit (HOSPITAL_BASED_OUTPATIENT_CLINIC_OR_DEPARTMENT_OTHER): Payer: Medicare Other | Admitting: Hematology & Oncology

## 2015-08-30 ENCOUNTER — Encounter: Payer: Self-pay | Admitting: Hematology & Oncology

## 2015-08-30 VITALS — BP 141/81 | HR 79 | Temp 98.1°F | Resp 18 | Ht 63.0 in | Wt 162.0 lb

## 2015-08-30 DIAGNOSIS — Z853 Personal history of malignant neoplasm of breast: Secondary | ICD-10-CM | POA: Diagnosis not present

## 2015-08-30 DIAGNOSIS — E559 Vitamin D deficiency, unspecified: Secondary | ICD-10-CM | POA: Diagnosis not present

## 2015-08-30 DIAGNOSIS — C50912 Malignant neoplasm of unspecified site of left female breast: Secondary | ICD-10-CM | POA: Diagnosis not present

## 2015-08-30 LAB — COMPREHENSIVE METABOLIC PANEL
ALBUMIN: 3.8 g/dL (ref 3.5–5.0)
ALK PHOS: 110 U/L (ref 40–150)
ALT: 33 U/L (ref 0–55)
AST: 26 U/L (ref 5–34)
Anion Gap: 9 mEq/L (ref 3–11)
BUN: 16.1 mg/dL (ref 7.0–26.0)
CALCIUM: 9.3 mg/dL (ref 8.4–10.4)
CO2: 27 mEq/L (ref 22–29)
CREATININE: 0.6 mg/dL (ref 0.6–1.1)
Chloride: 106 mEq/L (ref 98–109)
EGFR: 90 mL/min/{1.73_m2} — ABNORMAL LOW (ref >90)
Glucose: 78 mg/dl (ref 70–140)
Potassium: 4.2 mEq/L (ref 3.5–5.1)
Sodium: 141 mEq/L (ref 136–145)
TOTAL PROTEIN: 7.4 g/dL (ref 6.4–8.3)
Total Bilirubin: 0.55 mg/dL (ref 0.20–1.20)

## 2015-08-30 LAB — CBC WITH DIFFERENTIAL (CANCER CENTER ONLY)
BASO#: 0 10*3/uL (ref 0.0–0.2)
BASO%: 0.4 % (ref 0.0–2.0)
EOS%: 5 % (ref 0.0–7.0)
Eosinophils Absolute: 0.4 10*3/uL (ref 0.0–0.5)
HEMATOCRIT: 42.2 % (ref 34.8–46.6)
HGB: 14.2 g/dL (ref 11.6–15.9)
LYMPH#: 2.3 10*3/uL (ref 0.9–3.3)
LYMPH%: 33.1 % (ref 14.0–48.0)
MCH: 31.9 pg (ref 26.0–34.0)
MCHC: 33.6 g/dL (ref 32.0–36.0)
MCV: 95 fL (ref 81–101)
MONO#: 0.8 10*3/uL (ref 0.1–0.9)
MONO%: 11.3 % (ref 0.0–13.0)
NEUT#: 3.6 10*3/uL (ref 1.5–6.5)
NEUT%: 50.2 % (ref 39.6–80.0)
PLATELETS: 261 10*3/uL (ref 145–400)
RBC: 4.45 10*6/uL (ref 3.70–5.32)
RDW: 14.1 % (ref 11.1–15.7)
WBC: 7.1 10*3/uL (ref 3.9–10.0)

## 2015-08-30 NOTE — Progress Notes (Signed)
Hematology and Oncology Follow Up Visit  JANESSE START [id number] 1945/05/24 70 y.o. 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  Current Therapy:   Observation     Interim History:  [name] is back for followup .  She is doing pretty well. She just got back from the [place]. In 2 weeks, she and her husband will be going to [place] for their 50th anniversary.  She feels well. She's had no specific complaints. However, she had a mammogram done back in May. There was some "asymmetry" in the left breast. She will follow-up with this.  She is taking methotrexate injections for the psoriasis. This is keeping it under control.  She is taking her aspirin and vitamin D.  She has had no problem with fevers. She's had no change in bowel or bladder habits. She's had no nausea or vomiting.  Overall, her performance status is ECOG 0.   Medications:  Current Outpatient Prescriptions:  .  aspirin 81 MG tablet, Take 81 mg by mouth daily., Disp: , Rfl:  .  Calcium-Vitamin D-Vitamin K (VIACTIV PO), Take by mouth. Only with vitamin D, Disp: , Rfl:  .  celecoxib (CELEBREX) 200 MG capsule, Take 200 mg by mouth daily. , Disp: , Rfl:  .  folic acid (FOLVITE) 0.5 MG tablet, Take 1 mg by mouth daily., Disp: , Rfl:  .  glucosamine-chondroitin 500-400 MG tablet, Take 1 tablet by mouth every morning., Disp: , Rfl:  .  Magnesium 400 MG CAPS, Take by mouth every morning., Disp: , Rfl:  .  methotrexate (RHEUMATREX) 2.5 MG tablet, Take 250 mg by mouth once a week. Caution:Chemotherapy. Protect from light.  Takes 8 once a week., Disp: , Rfl:  .  SYNTHROID 137 MCG tablet, 137 mcg every morning. , Disp: , Rfl:   Allergies: No Known Allergies  Past Medical History, Surgical history, Social history, and Family History were reviewed and updated.  Review of Systems: As above  Physical Exam:  height is 5\' 3"  (1.6 m) and weight is 162 lb (73.5 kg). Her oral  temperature is 98.1 F (36.7 C). Her blood pressure is 141/81 (abnormal) and her pulse is 79. Her respiration is 18.   Well-developed and well-nourished white female. Head and neck exam shows no ocular or oral lesions. There are no palpable cervical or supraclavicular lymph nodes. Lungs are clear. Cardiac exam regular rate andrhythm with no murmurs rubs or bruits. Breast exam shows right breast with no masses, edema or erythema. There is no right axillary adenopathy. Left breast shows a well-healed lumpectomy at the 2:00 position. No masses noted in the left breast. There is no erythema or swelling. There is no left nipple discharge. There is no left axillary lymphadenopathy. Abdomen is soft. She has good bowel sounds. There is no fluid wave. There is no palpable liver or spleen tip. Back exam shows no tenderness over the spine ribs or hips. Extremities shows no clubbing cyanosis or edema. Skin exam shows no suspicious rashes.  Lab Results  Component Value Date   WBC 7.1 08/30/2015   HGB 14.2 08/30/2015   HCT 42.2 08/30/2015   MCV 95 08/30/2015   PLT 261 08/30/2015     Chemistry      Component Value Date/Time   NA 139 08/28/2014 0924   K 5.3 08/28/2014 0924   CL 102 08/28/2014 0924   CO2 26 08/28/2014 0924   BUN 20 08/28/2014 0924  CREATININE 0.53 08/28/2014 0924      Component Value Date/Time   CALCIUM 9.2 08/28/2014 0924   ALKPHOS 106 08/28/2014 0924   AST 18 08/28/2014 0924   ALT 18 08/28/2014 0924   BILITOT 0.5 08/28/2014 0924         Impression and Plan: Lauren [name] is 70 year old white female with a past history of stage I infiltrating lobular carcinoma of the left breast. She underwent lumpectomy and radiation. She had tamoxifen. This was completed back in 2004.   I'm not sure what is going on with the left breast on her recent mammogram. She goes back for additional studies. I told her that if this happens to be breast cancer, she probably will need to have a  mastectomy.   We will continue to see her back yearly. She enjoys seeing [place] once a year.   [name], MD 8/18/20171:00 PM

## 2015-08-31 LAB — VITAMIN D 25 HYDROXY (VIT D DEFICIENCY, FRACTURES): Vitamin D, 25-Hydroxy: 63.6 ng/mL (ref 30.0–100.0)

## 2015-09-03 ENCOUNTER — Other Ambulatory Visit: Payer: Self-pay | Admitting: Ophthalmology

## 2015-09-03 DIAGNOSIS — L919 Hypertrophic disorder of the skin, unspecified: Secondary | ICD-10-CM | POA: Diagnosis not present

## 2015-09-03 DIAGNOSIS — H40013 Open angle with borderline findings, low risk, bilateral: Secondary | ICD-10-CM | POA: Diagnosis not present

## 2015-09-03 DIAGNOSIS — H26492 Other secondary cataract, left eye: Secondary | ICD-10-CM | POA: Diagnosis not present

## 2015-09-03 DIAGNOSIS — Z961 Presence of intraocular lens: Secondary | ICD-10-CM | POA: Diagnosis not present

## 2015-09-03 DIAGNOSIS — D2312 Other benign neoplasm of skin of left eyelid, including canthus: Secondary | ICD-10-CM | POA: Diagnosis not present

## 2015-09-04 ENCOUNTER — Ambulatory Visit
Admission: RE | Admit: 2015-09-04 | Discharge: 2015-09-04 | Disposition: A | Discharge status: Home / Self Care | Payer: Medicare Other | Source: Ambulatory Visit | Attending: Hematology & Oncology | Admitting: Hematology & Oncology

## 2015-09-04 DIAGNOSIS — R928 Other abnormal and inconclusive findings on diagnostic imaging of breast: Secondary | ICD-10-CM | POA: Diagnosis not present

## 2015-09-04 DIAGNOSIS — M150 Primary generalized (osteo)arthritis: Secondary | ICD-10-CM | POA: Diagnosis not present

## 2015-09-04 DIAGNOSIS — Z79899 Other long term (current) drug therapy: Secondary | ICD-10-CM | POA: Diagnosis not present

## 2015-09-04 DIAGNOSIS — N6489 Other specified disorders of breast: Secondary | ICD-10-CM | POA: Diagnosis not present

## 2015-09-12 DIAGNOSIS — S91115A Laceration without foreign body of left lesser toe(s) without damage to nail, initial encounter: Secondary | ICD-10-CM | POA: Diagnosis not present

## 2015-10-03 DIAGNOSIS — E785 Hyperlipidemia, unspecified: Secondary | ICD-10-CM | POA: Diagnosis not present

## 2015-10-03 DIAGNOSIS — E039 Hypothyroidism, unspecified: Secondary | ICD-10-CM | POA: Diagnosis not present

## 2015-10-03 DIAGNOSIS — Z79899 Other long term (current) drug therapy: Secondary | ICD-10-CM | POA: Diagnosis not present

## 2015-10-11 DIAGNOSIS — E030 Congenital hypothyroidism with diffuse goiter: Secondary | ICD-10-CM | POA: Diagnosis not present

## 2015-10-11 DIAGNOSIS — Z23 Encounter for immunization: Secondary | ICD-10-CM | POA: Diagnosis not present

## 2015-10-11 DIAGNOSIS — E785 Hyperlipidemia, unspecified: Secondary | ICD-10-CM | POA: Diagnosis not present

## 2015-10-11 DIAGNOSIS — Z6830 Body mass index (BMI) 30.0-30.9, adult: Secondary | ICD-10-CM | POA: Diagnosis not present

## 2015-10-11 DIAGNOSIS — L4052 Psoriatic arthritis mutilans: Secondary | ICD-10-CM | POA: Diagnosis not present

## 2015-10-14 ENCOUNTER — Other Ambulatory Visit (HOSPITAL_COMMUNITY): Payer: Self-pay | Admitting: Internal Medicine

## 2015-10-14 ENCOUNTER — Ambulatory Visit (HOSPITAL_COMMUNITY)
Admission: RE | Admit: 2015-10-14 | Discharge: 2015-10-14 | Disposition: A | Discharge status: Home / Self Care | Payer: Medicare Other | Source: Ambulatory Visit | Attending: Internal Medicine | Admitting: Internal Medicine

## 2015-10-14 DIAGNOSIS — J9811 Atelectasis: Secondary | ICD-10-CM | POA: Diagnosis not present

## 2015-10-14 DIAGNOSIS — R918 Other nonspecific abnormal finding of lung field: Secondary | ICD-10-CM | POA: Diagnosis not present

## 2015-10-14 DIAGNOSIS — R0602 Shortness of breath: Secondary | ICD-10-CM

## 2015-11-07 DIAGNOSIS — D225 Melanocytic nevi of trunk: Secondary | ICD-10-CM | POA: Diagnosis not present

## 2015-11-07 DIAGNOSIS — Z1283 Encounter for screening for malignant neoplasm of skin: Secondary | ICD-10-CM | POA: Diagnosis not present

## 2015-12-09 DIAGNOSIS — E785 Hyperlipidemia, unspecified: Secondary | ICD-10-CM | POA: Diagnosis not present

## 2015-12-09 DIAGNOSIS — E039 Hypothyroidism, unspecified: Secondary | ICD-10-CM | POA: Diagnosis not present

## 2015-12-17 DIAGNOSIS — M150 Primary generalized (osteo)arthritis: Secondary | ICD-10-CM | POA: Diagnosis not present

## 2015-12-17 DIAGNOSIS — L401 Generalized pustular psoriasis: Secondary | ICD-10-CM | POA: Diagnosis not present

## 2015-12-17 DIAGNOSIS — L4050 Arthropathic psoriasis, unspecified: Secondary | ICD-10-CM | POA: Diagnosis not present

## 2015-12-17 DIAGNOSIS — E663 Overweight: Secondary | ICD-10-CM | POA: Diagnosis not present

## 2015-12-17 DIAGNOSIS — Z6829 Body mass index (BMI) 29.0-29.9, adult: Secondary | ICD-10-CM | POA: Diagnosis not present

## 2015-12-19 DIAGNOSIS — E039 Hypothyroidism, unspecified: Secondary | ICD-10-CM | POA: Diagnosis not present

## 2015-12-19 DIAGNOSIS — E785 Hyperlipidemia, unspecified: Secondary | ICD-10-CM | POA: Diagnosis not present

## 2016-01-09 DIAGNOSIS — L4050 Arthropathic psoriasis, unspecified: Secondary | ICD-10-CM | POA: Diagnosis not present

## 2016-06-03 DIAGNOSIS — M150 Primary generalized (osteo)arthritis: Secondary | ICD-10-CM | POA: Diagnosis not present

## 2016-06-03 DIAGNOSIS — E663 Overweight: Secondary | ICD-10-CM | POA: Diagnosis not present

## 2016-06-03 DIAGNOSIS — L401 Generalized pustular psoriasis: Secondary | ICD-10-CM | POA: Diagnosis not present

## 2016-06-03 DIAGNOSIS — Z6828 Body mass index (BMI) 28.0-28.9, adult: Secondary | ICD-10-CM | POA: Diagnosis not present

## 2016-06-03 DIAGNOSIS — L4050 Arthropathic psoriasis, unspecified: Secondary | ICD-10-CM | POA: Diagnosis not present

## 2016-08-31 ENCOUNTER — Other Ambulatory Visit: Payer: Medicare Other

## 2016-08-31 ENCOUNTER — Ambulatory Visit: Payer: Medicare Other | Admitting: Hematology & Oncology

## 2016-09-03 ENCOUNTER — Ambulatory Visit: Payer: Medicare Other | Admitting: Hematology & Oncology

## 2016-09-03 ENCOUNTER — Other Ambulatory Visit: Payer: Medicare Other

## 2016-09-30 DIAGNOSIS — L4050 Arthropathic psoriasis, unspecified: Secondary | ICD-10-CM | POA: Diagnosis not present

## 2016-09-30 DIAGNOSIS — Z6829 Body mass index (BMI) 29.0-29.9, adult: Secondary | ICD-10-CM | POA: Diagnosis not present

## 2016-09-30 DIAGNOSIS — L401 Generalized pustular psoriasis: Secondary | ICD-10-CM | POA: Diagnosis not present

## 2016-09-30 DIAGNOSIS — E663 Overweight: Secondary | ICD-10-CM | POA: Diagnosis not present

## 2016-09-30 DIAGNOSIS — M150 Primary generalized (osteo)arthritis: Secondary | ICD-10-CM | POA: Diagnosis not present

## 2016-10-01 ENCOUNTER — Other Ambulatory Visit: Payer: Self-pay | Admitting: Hematology & Oncology

## 2016-10-01 DIAGNOSIS — Z1231 Encounter for screening mammogram for malignant neoplasm of breast: Secondary | ICD-10-CM

## 2016-10-08 ENCOUNTER — Ambulatory Visit (HOSPITAL_COMMUNITY)
Admission: RE | Admit: 2016-10-08 | Discharge: 2016-10-08 | Disposition: A | Discharge status: Home / Self Care | Payer: Medicare Other | Source: Ambulatory Visit | Attending: Hematology & Oncology | Admitting: Hematology & Oncology

## 2016-10-08 DIAGNOSIS — Z1231 Encounter for screening mammogram for malignant neoplasm of breast: Secondary | ICD-10-CM | POA: Insufficient documentation

## 2016-10-30 ENCOUNTER — Other Ambulatory Visit: Payer: Self-pay | Admitting: *Deleted

## 2016-10-30 DIAGNOSIS — C50912 Malignant neoplasm of unspecified site of left female breast: Secondary | ICD-10-CM

## 2016-11-02 ENCOUNTER — Other Ambulatory Visit (HOSPITAL_BASED_OUTPATIENT_CLINIC_OR_DEPARTMENT_OTHER): Payer: Medicare Other

## 2016-11-02 ENCOUNTER — Ambulatory Visit (HOSPITAL_BASED_OUTPATIENT_CLINIC_OR_DEPARTMENT_OTHER): Payer: Medicare Other | Admitting: Family

## 2016-11-02 DIAGNOSIS — C50912 Malignant neoplasm of unspecified site of left female breast: Secondary | ICD-10-CM | POA: Diagnosis present

## 2016-11-02 DIAGNOSIS — Z923 Personal history of irradiation: Secondary | ICD-10-CM | POA: Diagnosis not present

## 2016-11-02 DIAGNOSIS — Z853 Personal history of malignant neoplasm of breast: Secondary | ICD-10-CM | POA: Diagnosis not present

## 2016-11-02 LAB — CMP (CANCER CENTER ONLY)
ALBUMIN: 3.9 g/dL (ref 3.3–5.5)
ALT(SGPT): 25 U/L (ref 10–47)
AST: 26 U/L (ref 11–38)
Alkaline Phosphatase: 94 U/L — ABNORMAL HIGH (ref 26–84)
BUN: 11 mg/dL (ref 7–22)
CO2: 27 meq/L (ref 18–33)
CREATININE: 0.9 mg/dL (ref 0.6–1.2)
Calcium: 9.2 mg/dL (ref 8.0–10.3)
Chloride: 104 mEq/L (ref 98–108)
Glucose, Bld: 110 mg/dL (ref 73–118)
POTASSIUM: 4.4 meq/L (ref 3.3–4.7)
SODIUM: 143 meq/L (ref 128–145)
Total Bilirubin: 0.8 mg/dl (ref 0.20–1.60)
Total Protein: 7.3 g/dL (ref 6.4–8.1)

## 2016-11-02 LAB — CBC WITH DIFFERENTIAL (CANCER CENTER ONLY)
BASO#: 0 10*3/uL (ref 0.0–0.2)
BASO%: 0.5 % (ref 0.0–2.0)
EOS ABS: 0.4 10*3/uL (ref 0.0–0.5)
EOS%: 4.8 % (ref 0.0–7.0)
HCT: 43.7 % (ref 34.8–46.6)
HGB: 14.7 g/dL (ref 11.6–15.9)
LYMPH#: 2.8 10*3/uL (ref 0.9–3.3)
LYMPH%: 32.6 % (ref 14.0–48.0)
MCH: 31.7 pg (ref 26.0–34.0)
MCHC: 33.6 g/dL (ref 32.0–36.0)
MCV: 94 fL (ref 81–101)
MONO#: 0.8 10*3/uL (ref 0.1–0.9)
MONO%: 8.9 % (ref 0.0–13.0)
NEUT#: 4.6 10*3/uL (ref 1.5–6.5)
NEUT%: 53.2 % (ref 39.6–80.0)
PLATELETS: 275 10*3/uL (ref 145–400)
RBC: 4.64 10*6/uL (ref 3.70–5.32)
RDW: 13.8 % (ref 11.1–15.7)
WBC: 8.7 10*3/uL (ref 3.9–10.0)

## 2016-11-02 NOTE — Progress Notes (Signed)
Hematology and Oncology Follow Up Visit  Lauren [name] [id number] 03/14/1945 71 y.o. 11/02/2016   Principle Diagnosis:  Stage I (T1 N0 M0) lobular carcinoma of the left breast  Current Therapy:   Observation   Interim History:  Lauren [name] is here today for follow-up. She is doing quite well and has no complaints at this time.  Her mammogram in September was negative. Her exam today was negative as well. No mass, lesion or rash. Left breast incision site at the 2 o'clock position intact.  No fever, chills, n/v, cough,r ash, dizziness, chest pain, palpitations, abdominal pain or changes in bowel or bladder habits.  She has occasional SOB with over exertion which is unchanged. She states that she used to be a smoker and quit 12 years ago.  No bleeding, bruising or petechiae. No lymphadenopathy found on exam.  No swelling, numbness or tingling in her extremities. She has generalized arthritic pain in her joints. Her right arm has been bothering her off and in and she plans to follow-up with her PCP regarding this.  She has maintained a good appetite and is staying well hydrated. Her weight is stable.   ECOG Performance Status: 0 - Asymptomatic  Medications:  Allergies as of 11/02/2016   No Known Allergies     Medication List       Accurate as of 11/02/16  4:26 PM. Always use your most recent med list.          aspirin 81 MG tablet Take 81 mg by mouth daily.   celecoxib 200 MG capsule Commonly known as:  CELEBREX Take 200 mg by mouth daily.   folic acid 0.5 MG tablet Commonly known as:  FOLVITE Take 1.5 mg by mouth daily.   glucosamine-chondroitin 500-400 MG tablet Take 1 tablet by mouth every morning.   Magnesium 400 MG Caps Take by mouth every morning.   Methotrexate (PF) 10 MG/0.2ML Soaj Inject into the skin once a week.   SYNTHROID 137 MCG tablet Generic drug:  levothyroxine 137 mcg every morning.   VIACTIV PO Take by mouth. Only with vitamin D         Allergies: No Known Allergies  Past Medical History, Surgical history, Social history, and Family History were reviewed and updated.  Review of Systems: All other 10 point review of systems is negative.   Physical Exam:  weight is 166 lb (75.3 kg). Her oral temperature is 98 F (36.7 C). Her blood pressure is 140/65 and her pulse is 90. Her respiration is 16 and oxygen saturation is 97%.   Wt Readings from Last 3 Encounters:  11/02/16 166 lb (75.3 kg)  08/30/15 162 lb (73.5 kg)  08/28/14 165 lb (74.8 kg)    Ocular: Sclerae unicteric, pupils equal, round and reactive to light Ear-nose-throat: Oropharynx clear, dentition fair Lymphatic: No cervical, supraclavicular or axillary adenopathy Lungs no rales or rhonchi, good excursion bilaterally Heart regular rate and rhythm, no murmur appreciated Abd soft, nontender, positive bowel sounds, no liver or spleen tip palpated on exam, no fluid wave  MSK no focal spinal tenderness, no joint edema Neuro: non-focal, well-oriented, appropriate affect Breasts: No change with right breast. Left breast lumpectomy incision site intact. No mass, lesion or rash noted on exam.   Lab Results  Component Value Date   WBC 8.7 11/02/2016   HGB 14.7 11/02/2016   HCT 43.7 11/02/2016   MCV 94 11/02/2016   PLT 275 11/02/2016   No results found for: FERRITIN, IRON, TIBC,  UIBC, IRONPCTSAT Lab Results  Component Value Date   RBC 4.64 11/02/2016   No results found for: KPAFRELGTCHN, LAMBDASER, KAPLAMBRATIO No results found for: IGGSERUM, IGA, IGMSERUM No results found for: Odetta Pink, SPEI   Chemistry      Component Value Date/Time   NA 143 11/02/2016 1335   NA 141 08/30/2015 1149   K 4.4 11/02/2016 1335   K 4.2 08/30/2015 1149   CL 104 11/02/2016 1335   CO2 27 11/02/2016 1335   CO2 27 08/30/2015 1149   BUN 11 11/02/2016 1335   BUN 16.1 08/30/2015 1149   CREATININE 0.9 11/02/2016 1335    CREATININE 0.6 08/30/2015 1149      Component Value Date/Time   CALCIUM 9.2 11/02/2016 1335   CALCIUM 9.3 08/30/2015 1149   ALKPHOS 94 (H) 11/02/2016 1335   ALKPHOS 110 08/30/2015 1149   AST 26 11/02/2016 1335   AST 26 08/30/2015 1149   ALT 25 11/02/2016 1335   ALT 33 08/30/2015 1149   BILITOT 0.80 11/02/2016 1335   BILITOT 0.55 08/30/2015 1149      Impression and Plan: Lauren [name] is a very pleasant 71 yo caucasian female with history of stage I infiltrating lobular carcinoma of the left breast. She had a lumpectomy and radiation followed by tamoxifen completed in 2004.  Her mammogram last month was negative and exam today was negative as well.  We will continue to follow along with her and plan to see her back again in another year.  She is in agreement with the plan and will contact our office with any questions or concerns. We can certainly see her sooner if need be.   Eliezer Bottom, NP 10/22/20184:26 PM

## 2016-11-12 DIAGNOSIS — C50912 Malignant neoplasm of unspecified site of left female breast: Secondary | ICD-10-CM | POA: Diagnosis not present

## 2016-11-12 DIAGNOSIS — E039 Hypothyroidism, unspecified: Secondary | ICD-10-CM | POA: Diagnosis not present

## 2016-11-12 DIAGNOSIS — E785 Hyperlipidemia, unspecified: Secondary | ICD-10-CM | POA: Diagnosis not present

## 2016-11-12 DIAGNOSIS — Z79899 Other long term (current) drug therapy: Secondary | ICD-10-CM | POA: Diagnosis not present

## 2016-11-19 DIAGNOSIS — Z6831 Body mass index (BMI) 31.0-31.9, adult: Secondary | ICD-10-CM | POA: Diagnosis not present

## 2016-11-19 DIAGNOSIS — L4052 Psoriatic arthritis mutilans: Secondary | ICD-10-CM | POA: Diagnosis not present

## 2016-11-19 DIAGNOSIS — E785 Hyperlipidemia, unspecified: Secondary | ICD-10-CM | POA: Diagnosis not present

## 2016-11-19 DIAGNOSIS — Z23 Encounter for immunization: Secondary | ICD-10-CM | POA: Diagnosis not present

## 2016-11-19 DIAGNOSIS — E039 Hypothyroidism, unspecified: Secondary | ICD-10-CM | POA: Diagnosis not present

## 2016-11-24 DIAGNOSIS — H40013 Open angle with borderline findings, low risk, bilateral: Secondary | ICD-10-CM | POA: Diagnosis not present

## 2016-11-24 DIAGNOSIS — H26492 Other secondary cataract, left eye: Secondary | ICD-10-CM | POA: Diagnosis not present

## 2016-11-24 DIAGNOSIS — H35372 Puckering of macula, left eye: Secondary | ICD-10-CM | POA: Diagnosis not present

## 2016-11-24 DIAGNOSIS — Z961 Presence of intraocular lens: Secondary | ICD-10-CM | POA: Diagnosis not present

## 2016-12-14 DIAGNOSIS — G8929 Other chronic pain: Secondary | ICD-10-CM | POA: Diagnosis not present

## 2016-12-14 DIAGNOSIS — M25511 Pain in right shoulder: Secondary | ICD-10-CM | POA: Diagnosis not present

## 2016-12-28 DIAGNOSIS — E663 Overweight: Secondary | ICD-10-CM | POA: Diagnosis not present

## 2016-12-28 DIAGNOSIS — L401 Generalized pustular psoriasis: Secondary | ICD-10-CM | POA: Diagnosis not present

## 2016-12-28 DIAGNOSIS — L4050 Arthropathic psoriasis, unspecified: Secondary | ICD-10-CM | POA: Diagnosis not present

## 2016-12-28 DIAGNOSIS — M150 Primary generalized (osteo)arthritis: Secondary | ICD-10-CM | POA: Diagnosis not present

## 2016-12-28 DIAGNOSIS — Z6828 Body mass index (BMI) 28.0-28.9, adult: Secondary | ICD-10-CM | POA: Diagnosis not present

## 2017-05-24 DIAGNOSIS — L401 Generalized pustular psoriasis: Secondary | ICD-10-CM | POA: Diagnosis not present

## 2017-05-24 DIAGNOSIS — M150 Primary generalized (osteo)arthritis: Secondary | ICD-10-CM | POA: Diagnosis not present

## 2017-05-24 DIAGNOSIS — L4050 Arthropathic psoriasis, unspecified: Secondary | ICD-10-CM | POA: Diagnosis not present

## 2017-05-24 DIAGNOSIS — Z6829 Body mass index (BMI) 29.0-29.9, adult: Secondary | ICD-10-CM | POA: Diagnosis not present

## 2017-05-24 DIAGNOSIS — E663 Overweight: Secondary | ICD-10-CM | POA: Diagnosis not present

## 2017-05-27 DIAGNOSIS — L4052 Psoriatic arthritis mutilans: Secondary | ICD-10-CM | POA: Diagnosis not present

## 2017-05-27 DIAGNOSIS — E785 Hyperlipidemia, unspecified: Secondary | ICD-10-CM | POA: Diagnosis not present

## 2017-05-27 DIAGNOSIS — E039 Hypothyroidism, unspecified: Secondary | ICD-10-CM | POA: Diagnosis not present

## 2017-06-03 DIAGNOSIS — E785 Hyperlipidemia, unspecified: Secondary | ICD-10-CM | POA: Diagnosis not present

## 2017-06-03 DIAGNOSIS — Z6830 Body mass index (BMI) 30.0-30.9, adult: Secondary | ICD-10-CM | POA: Diagnosis not present

## 2017-06-03 DIAGNOSIS — E039 Hypothyroidism, unspecified: Secondary | ICD-10-CM | POA: Diagnosis not present

## 2017-06-09 DIAGNOSIS — L4050 Arthropathic psoriasis, unspecified: Secondary | ICD-10-CM | POA: Diagnosis not present

## 2017-07-07 DIAGNOSIS — L4050 Arthropathic psoriasis, unspecified: Secondary | ICD-10-CM | POA: Diagnosis not present

## 2017-11-02 ENCOUNTER — Other Ambulatory Visit: Payer: Medicare Other

## 2017-11-02 ENCOUNTER — Ambulatory Visit: Payer: Medicare Other | Admitting: Family

## 2017-11-18 ENCOUNTER — Other Ambulatory Visit (HOSPITAL_COMMUNITY): Payer: Self-pay | Admitting: Internal Medicine

## 2017-11-18 DIAGNOSIS — Z1231 Encounter for screening mammogram for malignant neoplasm of breast: Secondary | ICD-10-CM

## 2017-11-23 DIAGNOSIS — Z6828 Body mass index (BMI) 28.0-28.9, adult: Secondary | ICD-10-CM | POA: Diagnosis not present

## 2017-11-23 DIAGNOSIS — E663 Overweight: Secondary | ICD-10-CM | POA: Diagnosis not present

## 2017-11-23 DIAGNOSIS — L401 Generalized pustular psoriasis: Secondary | ICD-10-CM | POA: Diagnosis not present

## 2017-11-23 DIAGNOSIS — L4050 Arthropathic psoriasis, unspecified: Secondary | ICD-10-CM | POA: Diagnosis not present

## 2017-11-23 DIAGNOSIS — M150 Primary generalized (osteo)arthritis: Secondary | ICD-10-CM | POA: Diagnosis not present

## 2017-11-24 ENCOUNTER — Ambulatory Visit (HOSPITAL_COMMUNITY)
Admission: RE | Admit: 2017-11-24 | Discharge: 2017-11-24 | Disposition: A | Discharge status: Home / Self Care | Payer: Medicare Other | Source: Ambulatory Visit | Attending: Internal Medicine | Admitting: Internal Medicine

## 2017-11-24 DIAGNOSIS — H35352 Cystoid macular degeneration, left eye: Secondary | ICD-10-CM | POA: Diagnosis not present

## 2017-11-24 DIAGNOSIS — Z961 Presence of intraocular lens: Secondary | ICD-10-CM | POA: Diagnosis not present

## 2017-11-24 DIAGNOSIS — H26492 Other secondary cataract, left eye: Secondary | ICD-10-CM | POA: Diagnosis not present

## 2017-11-24 DIAGNOSIS — H40013 Open angle with borderline findings, low risk, bilateral: Secondary | ICD-10-CM | POA: Diagnosis not present

## 2017-11-24 DIAGNOSIS — Z1231 Encounter for screening mammogram for malignant neoplasm of breast: Secondary | ICD-10-CM | POA: Diagnosis not present

## 2017-11-24 DIAGNOSIS — H31092 Other chorioretinal scars, left eye: Secondary | ICD-10-CM | POA: Diagnosis not present

## 2017-11-25 DIAGNOSIS — R030 Elevated blood-pressure reading, without diagnosis of hypertension: Secondary | ICD-10-CM | POA: Diagnosis not present

## 2017-11-25 DIAGNOSIS — Z23 Encounter for immunization: Secondary | ICD-10-CM | POA: Diagnosis not present

## 2017-11-26 ENCOUNTER — Other Ambulatory Visit: Payer: Self-pay | Admitting: *Deleted

## 2017-11-26 DIAGNOSIS — C50912 Malignant neoplasm of unspecified site of left female breast: Secondary | ICD-10-CM

## 2017-11-29 ENCOUNTER — Inpatient Hospital Stay: Payer: Medicare Other | Attending: Hematology & Oncology

## 2017-11-29 ENCOUNTER — Encounter: Payer: Self-pay | Admitting: Hematology & Oncology

## 2017-11-29 ENCOUNTER — Inpatient Hospital Stay (HOSPITAL_BASED_OUTPATIENT_CLINIC_OR_DEPARTMENT_OTHER): Payer: Medicare Other | Admitting: Hematology & Oncology

## 2017-11-29 ENCOUNTER — Other Ambulatory Visit: Payer: Self-pay

## 2017-11-29 VITALS — BP 128/74 | HR 83 | Temp 98.3°F | Resp 18 | Wt 161.0 lb

## 2017-11-29 DIAGNOSIS — Z923 Personal history of irradiation: Secondary | ICD-10-CM | POA: Insufficient documentation

## 2017-11-29 DIAGNOSIS — C50912 Malignant neoplasm of unspecified site of left female breast: Secondary | ICD-10-CM | POA: Diagnosis not present

## 2017-11-29 DIAGNOSIS — Z79899 Other long term (current) drug therapy: Secondary | ICD-10-CM

## 2017-11-29 LAB — CBC WITH DIFFERENTIAL (CANCER CENTER ONLY)
Abs Immature Granulocytes: 0.05 10*3/uL (ref 0.00–0.07)
BASOS ABS: 0.1 10*3/uL (ref 0.0–0.1)
Basophils Relative: 1 %
Eosinophils Absolute: 0.2 10*3/uL (ref 0.0–0.5)
Eosinophils Relative: 2 %
HCT: 44.2 % (ref 36.0–46.0)
HEMOGLOBIN: 14.2 g/dL (ref 12.0–15.0)
IMMATURE GRANULOCYTES: 1 %
LYMPHS PCT: 40 %
Lymphs Abs: 3.1 10*3/uL (ref 0.7–4.0)
MCH: 30.3 pg (ref 26.0–34.0)
MCHC: 32.1 g/dL (ref 30.0–36.0)
MCV: 94.2 fL (ref 80.0–100.0)
MONOS PCT: 10 %
Monocytes Absolute: 0.8 10*3/uL (ref 0.1–1.0)
NEUTROS PCT: 46 %
NRBC: 0 % (ref 0.0–0.2)
Neutro Abs: 3.6 10*3/uL (ref 1.7–7.7)
Platelet Count: 262 10*3/uL (ref 150–400)
RBC: 4.69 MIL/uL (ref 3.87–5.11)
RDW: 13.2 % (ref 11.5–15.5)
WBC Count: 7.7 10*3/uL (ref 4.0–10.5)

## 2017-11-29 LAB — CMP (CANCER CENTER ONLY)
ALBUMIN: 3.8 g/dL (ref 3.5–5.0)
ALK PHOS: 103 U/L — AB (ref 26–84)
ALT: 45 U/L (ref 10–47)
ANION GAP: 9 (ref 5–15)
AST: 38 U/L (ref 11–38)
BILIRUBIN TOTAL: 0.6 mg/dL (ref 0.2–1.6)
BUN: 16 mg/dL (ref 7–22)
CALCIUM: 9.1 mg/dL (ref 8.0–10.3)
CO2: 27 mmol/L (ref 18–33)
CREATININE: 0.7 mg/dL (ref 0.60–1.20)
Chloride: 106 mmol/L (ref 98–108)
Glucose, Bld: 93 mg/dL (ref 73–118)
Potassium: 3.9 mmol/L (ref 3.3–4.7)
Sodium: 142 mmol/L (ref 128–145)
TOTAL PROTEIN: 7.6 g/dL (ref 6.4–8.1)

## 2017-11-29 NOTE — Progress Notes (Signed)
Hematology and Oncology Follow Up Visit  Lauren [name] [id number] 1945/12/14 72 y.o. 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 She is doing quite well and has no complaints at this time.  Her mammogram in September was negative. Her exam today was negative as well. No mass, lesion or rash. Left breast incision site at the 2 o'clock position intact.  No fever, chills, n/v, cough,r ash, dizziness, chest pain, palpitations, abdominal pain or changes in bowel or bladder habits.  She has occasional SOB with over exertion which is unchanged. She states that she used to be a smoker and quit 12 years ago.  No bleeding, bruising or petechiae. No lymphadenopathy found on exam.  No swelling, numbness or tingling in her extremities. She has generalized arthritic pain in her joints. Her right arm has been bothering her off and in and she plans to follow-up with her PCP regarding this.  She has maintained a good appetite and is staying well hydrated. Her weight is stable.   ECOG Performance Status: 0 - Asymptomatic  Medications:  Allergies as of 11/29/2017   No Known Allergies     Medication List        Accurate as of 11/29/17  1:41 PM. Always use your most recent med list.          glucosamine-chondroitin 500-400 MG tablet Take 1 tablet by mouth every morning.   ILEVRO 0.3 % ophthalmic suspension Generic drug:  nepafenac   Magnesium 400 MG Caps Take by mouth every morning.   omeprazole 20 MG capsule Commonly known as:  PRILOSEC Take 20 mg by mouth daily.   SYNTHROID 112 MCG tablet Generic drug:  levothyroxine Take 112 mcg by mouth daily.   VIACTIV PO Take by mouth. Only with vitamin D       Allergies: No Known Allergies  Past Medical History, Surgical history, Social history, and Family History were reviewed and  updated.  Review of Systems: All other 10 point review of systems is negative.   Physical Exam:  weight is 161 lb (73 kg). Her oral temperature is 98.3 F (36.8 C). Her blood pressure is 128/74 and her pulse is 83. Her respiration is 18 and oxygen saturation is 96%.   Wt Readings from Last 3 Encounters:  11/29/17 161 lb (73 kg)  11/02/16 166 lb (75.3 kg)  08/30/15 162 lb (73.5 kg)    Ocular: Sclerae unicteric, pupils equal, round and reactive to light Ear-nose-throat: Oropharynx clear, dentition fair Lymphatic: No cervical, supraclavicular or axillary adenopathy Lungs no rales or rhonchi, good excursion bilaterally Heart regular rate and rhythm, no murmur appreciated Abd soft, nontender, positive bowel sounds, no liver or spleen tip palpated on exam, no fluid wave  MSK no focal spinal tenderness, no joint edema Neuro: non-focal, well-oriented, appropriate affect Breasts: No change with right breast. Left breast lumpectomy incision site intact. No mass, lesion or rash noted on exam.   Lab Results  Component Value Date   WBC 7.7 11/29/2017   HGB 14.2 11/29/2017   HCT 44.2 11/29/2017   MCV 94.2 11/29/2017   PLT 262 11/29/2017   No results found for: FERRITIN, IRON, TIBC, UIBC, IRONPCTSAT Lab Results  Component Value Date   RBC 4.69 11/29/2017   No results found for: KPAFRELGTCHN, LAMBDASER, KAPLAMBRATIO No results found for: IGGSERUM, IGA, IGMSERUM No results  found for: Odetta Pink, SPEI   Chemistry      Component Value Date/Time   NA 142 11/29/2017 1122   NA 143 11/02/2016 1335   NA 141 08/30/2015 1149   K 3.9 11/29/2017 1122   K 4.4 11/02/2016 1335   K 4.2 08/30/2015 1149   CL 106 11/29/2017 1122   CL 104 11/02/2016 1335   CO2 27 11/29/2017 1122   CO2 27 11/02/2016 1335   CO2 27 08/30/2015 1149   BUN 16 11/29/2017 1122   BUN 11 11/02/2016 1335   BUN 16.1 08/30/2015 1149   CREATININE 0.70 11/29/2017 1122    CREATININE 0.9 11/02/2016 1335   CREATININE 0.6 08/30/2015 1149      Component Value Date/Time   CALCIUM 9.1 11/29/2017 1122   CALCIUM 9.2 11/02/2016 1335   CALCIUM 9.3 08/30/2015 1149   ALKPHOS 103 (H) 11/29/2017 1122   ALKPHOS 94 (H) 11/02/2016 1335   ALKPHOS 110 08/30/2015 1149   AST 38 11/29/2017 1122   AST 26 08/30/2015 1149   ALT 45 11/29/2017 1122   ALT 25 11/02/2016 1335   ALT 33 08/30/2015 1149   BILITOT 0.6 11/29/2017 1122   BILITOT 0.55 08/30/2015 1149      Impression and Plan: Lauren [name] is a very pleasant 72 yo caucasian female with history of stage I infiltrating lobular carcinoma of the left breast.   She had a lumpectomy and radiation followed by tamoxifen completed in 2004.   Her mammogram this month was negative and exam today was negative as well.   We will continue to follow along with her and plan to see her back again in another year.   She is in agreement with the plan and will contact our office with any questions or concerns. We can certainly see her sooner if need be.   [name], MD 11/18/20191:41 PM

## 2017-12-06 DIAGNOSIS — H35372 Puckering of macula, left eye: Secondary | ICD-10-CM | POA: Diagnosis not present

## 2017-12-06 DIAGNOSIS — H43812 Vitreous degeneration, left eye: Secondary | ICD-10-CM | POA: Diagnosis not present

## 2017-12-06 DIAGNOSIS — Z961 Presence of intraocular lens: Secondary | ICD-10-CM | POA: Diagnosis not present

## 2017-12-17 DIAGNOSIS — C50919 Malignant neoplasm of unspecified site of unspecified female breast: Secondary | ICD-10-CM | POA: Diagnosis not present

## 2017-12-17 DIAGNOSIS — E785 Hyperlipidemia, unspecified: Secondary | ICD-10-CM | POA: Diagnosis not present

## 2017-12-17 DIAGNOSIS — Z79899 Other long term (current) drug therapy: Secondary | ICD-10-CM | POA: Diagnosis not present

## 2017-12-17 DIAGNOSIS — E039 Hypothyroidism, unspecified: Secondary | ICD-10-CM | POA: Diagnosis not present

## 2017-12-17 DIAGNOSIS — L4052 Psoriatic arthritis mutilans: Secondary | ICD-10-CM | POA: Diagnosis not present

## 2017-12-17 DIAGNOSIS — K219 Gastro-esophageal reflux disease without esophagitis: Secondary | ICD-10-CM | POA: Diagnosis not present

## 2017-12-22 DIAGNOSIS — H35372 Puckering of macula, left eye: Secondary | ICD-10-CM | POA: Diagnosis not present

## 2017-12-22 DIAGNOSIS — H33322 Round hole, left eye: Secondary | ICD-10-CM | POA: Diagnosis not present

## 2017-12-24 DIAGNOSIS — S61411A Laceration without foreign body of right hand, initial encounter: Secondary | ICD-10-CM | POA: Diagnosis not present

## 2017-12-24 DIAGNOSIS — Z6830 Body mass index (BMI) 30.0-30.9, adult: Secondary | ICD-10-CM | POA: Diagnosis not present

## 2017-12-24 DIAGNOSIS — E039 Hypothyroidism, unspecified: Secondary | ICD-10-CM | POA: Diagnosis not present

## 2017-12-24 DIAGNOSIS — Z23 Encounter for immunization: Secondary | ICD-10-CM | POA: Diagnosis not present

## 2017-12-24 DIAGNOSIS — L4050 Arthropathic psoriasis, unspecified: Secondary | ICD-10-CM | POA: Diagnosis not present

## 2017-12-24 DIAGNOSIS — E785 Hyperlipidemia, unspecified: Secondary | ICD-10-CM | POA: Diagnosis not present

## 2017-12-28 DIAGNOSIS — Z09 Encounter for follow-up examination after completed treatment for conditions other than malignant neoplasm: Secondary | ICD-10-CM | POA: Diagnosis not present

## 2017-12-28 DIAGNOSIS — H35372 Puckering of macula, left eye: Secondary | ICD-10-CM | POA: Diagnosis not present

## 2017-12-30 ENCOUNTER — Other Ambulatory Visit (HOSPITAL_COMMUNITY): Payer: Self-pay | Admitting: Internal Medicine

## 2017-12-30 DIAGNOSIS — E785 Hyperlipidemia, unspecified: Secondary | ICD-10-CM

## 2018-01-10 DIAGNOSIS — L4050 Arthropathic psoriasis, unspecified: Secondary | ICD-10-CM | POA: Diagnosis not present

## 2018-01-20 ENCOUNTER — Other Ambulatory Visit (HOSPITAL_COMMUNITY): Payer: Medicare Other

## 2018-01-20 ENCOUNTER — Encounter (HOSPITAL_COMMUNITY): Payer: Self-pay

## 2018-07-21 DIAGNOSIS — Z79899 Other long term (current) drug therapy: Secondary | ICD-10-CM | POA: Diagnosis not present

## 2018-07-21 DIAGNOSIS — E785 Hyperlipidemia, unspecified: Secondary | ICD-10-CM | POA: Diagnosis not present

## 2018-07-25 DIAGNOSIS — Z961 Presence of intraocular lens: Secondary | ICD-10-CM | POA: Diagnosis not present

## 2018-07-25 DIAGNOSIS — H40013 Open angle with borderline findings, low risk, bilateral: Secondary | ICD-10-CM | POA: Diagnosis not present

## 2018-07-27 ENCOUNTER — Other Ambulatory Visit: Payer: Self-pay

## 2018-07-27 ENCOUNTER — Other Ambulatory Visit: Payer: Medicare Other

## 2018-07-27 DIAGNOSIS — Z20822 Contact with and (suspected) exposure to covid-19: Secondary | ICD-10-CM

## 2018-07-27 DIAGNOSIS — R6889 Other general symptoms and signs: Secondary | ICD-10-CM | POA: Diagnosis not present

## 2018-07-28 DIAGNOSIS — L4050 Arthropathic psoriasis, unspecified: Secondary | ICD-10-CM | POA: Diagnosis not present

## 2018-07-28 DIAGNOSIS — E785 Hyperlipidemia, unspecified: Secondary | ICD-10-CM | POA: Diagnosis not present

## 2018-07-28 DIAGNOSIS — K219 Gastro-esophageal reflux disease without esophagitis: Secondary | ICD-10-CM | POA: Diagnosis not present

## 2018-07-30 LAB — NOVEL CORONAVIRUS, NAA: SARS-CoV-2, NAA: NOT DETECTED

## 2018-08-09 ENCOUNTER — Telehealth: Payer: Self-pay

## 2018-08-09 DIAGNOSIS — L4050 Arthropathic psoriasis, unspecified: Secondary | ICD-10-CM | POA: Diagnosis not present

## 2018-08-09 DIAGNOSIS — Z6828 Body mass index (BMI) 28.0-28.9, adult: Secondary | ICD-10-CM | POA: Diagnosis not present

## 2018-08-09 DIAGNOSIS — Z9889 Other specified postprocedural states: Secondary | ICD-10-CM | POA: Diagnosis not present

## 2018-08-09 DIAGNOSIS — L401 Generalized pustular psoriasis: Secondary | ICD-10-CM | POA: Diagnosis not present

## 2018-08-09 DIAGNOSIS — E663 Overweight: Secondary | ICD-10-CM | POA: Diagnosis not present

## 2018-08-09 DIAGNOSIS — H35372 Puckering of macula, left eye: Secondary | ICD-10-CM | POA: Diagnosis not present

## 2018-08-09 DIAGNOSIS — M150 Primary generalized (osteo)arthritis: Secondary | ICD-10-CM | POA: Diagnosis not present

## 2018-08-09 DIAGNOSIS — Z961 Presence of intraocular lens: Secondary | ICD-10-CM | POA: Diagnosis not present

## 2018-08-09 NOTE — Telephone Encounter (Signed)
Called to check results of Covid test.  Advised negative.

## 2018-08-11 ENCOUNTER — Other Ambulatory Visit: Payer: Self-pay

## 2018-08-11 ENCOUNTER — Ambulatory Visit (HOSPITAL_COMMUNITY): Payer: Medicare Other | Attending: Internal Medicine | Admitting: Physical Therapy

## 2018-08-11 ENCOUNTER — Encounter (HOSPITAL_COMMUNITY): Payer: Self-pay | Admitting: Physical Therapy

## 2018-08-11 DIAGNOSIS — G8929 Other chronic pain: Secondary | ICD-10-CM | POA: Diagnosis not present

## 2018-08-11 DIAGNOSIS — R2681 Unsteadiness on feet: Secondary | ICD-10-CM | POA: Diagnosis not present

## 2018-08-11 DIAGNOSIS — M545 Low back pain: Secondary | ICD-10-CM | POA: Insufficient documentation

## 2018-08-11 DIAGNOSIS — M6281 Muscle weakness (generalized): Secondary | ICD-10-CM | POA: Diagnosis not present

## 2018-08-11 NOTE — Patient Instructions (Signed)
   HEEL BRIDGING  While lying on your back, tighten your lower abdominals, squeeze your buttocks, lift your toes and then raise your buttocks off the floor/bed as creating a "Bridge" with your body. You should be pressing through your heels the entire time.   Hold for 3 seconds.  Repeat 10 times, twice a day.    ELASTIC BAND - SEATED CLAMS - HIP ABDUCTION  While sitting in a chair and an elastic band wrapped around your knees, move both knees to the sides to separate your legs. Keep contact of your feet on the floor the entire time.   Repeat 10-15 times, twice a day.     Double Knee Chest Stretch  While laying on the back pull both knees up towards the chest until you feel a stretch in your back.  Hold for 10 seconds, then relax.  Repeat 5 times, twice a day.    SINGLE LEG STANCE - SLS  Stand on one leg and maintain your balance.  Try to hold 30 seconds. If this feels easy, close your eyes or turn your head back and forth with your eyes open.   Repeat 3 times each leg, twice a day.

## 2018-08-11 NOTE — Therapy (Signed)
[place], [place], [address] Phone: [phone]   Fax:  [phone]  Physical Therapy Evaluation  Patient Details  Name: Lauren [name] MRN: [id number] Date of Birth: January 04, 1946 Referring Provider (PT): Asencion Noble    Encounter Date: 08/11/2018  PT End of Session - 08/11/18 1015    Visit Number  1    Number of Visits  10    Date for PT Re-Evaluation  08/30/18    Authorization Type  Medicare and Generic [employer]    Authorization Time Period  08/11/18 to 08/30/18    Authorization - Visit Number  1    Authorization - Number of Visits  10    PT Start Time  0918    PT Stop Time  1000    PT Time Calculation (min)  42 min    Activity Tolerance  Patient tolerated treatment well    Behavior During Therapy  [facility] for tasks assessed/performed       History reviewed. No pertinent past medical history.  History reviewed. No pertinent surgical history.  There were no vitals filed for this visit.   Subjective Assessment - 08/11/18 0921    Subjective  I don't know what the problem is now, but about 35 years ago I was moving a rug down steps and felt something go in my low back and my right leg got numb for half a minute. Since then I've had problems and sometimes I can hardly move. I walk in the morning and it bothers me if it gets so bad that I have to take Prednisone to be able to do anything. I'd like to strengthen my low back and posture. I've been doing some crunches and last time my muscles between my shoulder blades. [place] and will be gone for 3 months.    How long can you sit comfortably?  as needed but is hard to get up, will work itself out if I walk    How long can you stand comfortably?  unlimited    How long can you walk comfortably?  helps pain, unlimited    Patient Stated Goals  build back strength, work on posture, balance    Currently in Pain?  No/denies         [facility] PT  Assessment - 08/11/18 0001      Assessment   Medical Diagnosis  back pain     Referring Provider (PT)  Asencion Noble     Onset Date/Surgical Date  --   23 years ago    Next MD Visit  December 28th     Prior Therapy  yes for a broken ankle       Precautions   Precautions  None      Restrictions   Weight Bearing Restrictions  No      Balance Screen   Has the patient fallen in the past 6 months  Yes    How many times?  2- walking in the morning     Has the patient had a decrease in activity level because of a fear of falling?   Yes    Is the patient reluctant to leave their home because of a fear of falling?   No      Home Film/video editor residence      Prior Function   Level of Independence  Independent  AROM   Lumbar Flexion  full ROM; repeated motions no change      Lumbar Extension  full ROM, repeated motions no change     Lumbar - Right Side Bend  50% limited     Lumbar - Left Side Bend  70% limited       Strength   Right Hip Flexion  3+/5    Right Hip Extension  3/5    Right Hip ABduction  3-/5    Left Hip Flexion  3+/5    Left Hip Extension  3/5    Left Hip ABduction  3-/5    Right Knee Flexion  4/5    Right Knee Extension  4+/5    Left Knee Flexion  4/5    Left Knee Extension  4+/5    Right Ankle Dorsiflexion  5/5    Left Ankle Dorsiflexion  5/5      Flexibility   Hamstrings  WNL     Piriformis  WNL       Palpation   Spinal mobility  moderate limitation with PAs to lumbar spine     Palpation comment  no significant muscle guarding or knotting noted, no TTP       Special Tests   Other special tests  SLR (-); scour (-); FABER (-)       Transfers   Five time sit to stand comments   14.5 seconds no UEs       Dynamic Gait Index   Level Surface  Normal    Change in Gait Speed  Normal    Gait with Horizontal Head Turns  Mild Impairment    Gait with Vertical Head Turns  Mild Impairment    Gait and Pivot Turn  Mild Impairment     Step Over Obstacle  Normal    Step Around Obstacles  Mild Impairment    Steps  Mild Impairment    Total Score  19                Objective measurements completed on examination: See above findings.      [facility] [place] Adult PT Treatment/Exercise - 08/11/18 0001      Lumbar Exercises: Stretches   Double Knee to Chest Stretch  5 reps;10 seconds      Lumbar Exercises: Seated   Other Seated Lumbar Exercises  seated clams red TB 1x10       Lumbar Exercises: Supine   Bridge  10 reps;3 seconds    Bridge Limitations  heels to glutes           Balance Exercises - 08/11/18 1013      Balance Exercises: Standing   SLS  Eyes open;Solid surface;1 rep;30 secs        PT Education - 08/11/18 1014    Education Details  exam findings, POC, HEP, importance of getting PT sessions in before she leaves for the [place]    Person(s) Educated  Patient    Methods  Explanation    Comprehension  Verbalized understanding       PT Short Term Goals - 08/11/18 1223      PT SHORT TERM GOAL #1   Title  Patient to show improvement of at least 1 MMT grade in all tested muscles in order to show improvement in functional strength    Time  3    Period  Weeks    Status  New    Target Date  09/01/18      PT SHORT  TERM GOAL #2   Title  Patient to improve score on DGI by at least 3 points in order to show reduced fall risk    Time  3    Period  Weeks    Status  New      PT SHORT TERM GOAL #3   Title  Patient to improve 5x sit to stand time to 12 seconds or less in order to show improved functional strength and mobility    Time  3    Period  Weeks    Status  New      PT SHORT TERM GOAL #4   Title  Patient to show full ROM in lumbar spine in all directions in order to reduce stiffness and general irritation in the region    Time  3    Period  Weeks    Status  New      PT SHORT TERM GOAL #5   Title  Patient to be compliant with advanced HEP for long term self-management movng forward     Time  3    Period  Weeks    Status  New        PT Long Term Goals - 08/11/18 1227      PT LONG TERM GOAL #1   Title  No LTGs appropriate at this time- will update PRN             Plan - 08/11/18 1217    Clinical Impression Statement  Ms. Boies arrives reporting ongoing back problems that started when she was moving furniture about 35 years ago and have intermittently persisted; on good days she feels fine, but on bad days she has a very had time getting out of bed and standing up, although Prednisone does relieve her symptoms. Examination reveals limited lumbar ROM, significant muscle weakness in proximal and core musculature, impaired posture, and reduced functional balance skills. She will benefit from a short course of skilled PT services before leaving for a lengthy trip to the [place] in late August in order to address functional deficits and promote extensive home exercise program moving forward.    Personal Factors and Comorbidities  Age;Fitness;Past/Current Experience;Comorbidity 1;Sex;Time since onset of injury/illness/exacerbation    Examination-Activity Limitations  Bathing;Locomotion Level;Transfers;Bed Mobility;Reach Overhead;Bend;Sit;Carry;Sleep;Squat;Stairs;Stand;Lift    Examination-Participation Restrictions  Yard Work;Cleaning;Community Activity;Driving;Interpersonal Relationship;Volunteer;Shop;Laundry    Stability/Clinical Decision Making  Stable/Uncomplicated    Clinical Decision Making  Low    Rehab Potential  Good    PT Frequency  3x / week    PT Duration  --   until she leaves for the [place] 09/01/18   PT Treatment/Interventions  ADLs/Self Care Home Management;Biofeedback;Cryotherapy;Electrical Stimulation;Iontophoresis 4mg /ml Dexamethasone;Moist Heat;Traction;Ultrasound;Gait training;Stair training;Functional mobility training;Therapeutic activities;Therapeutic exercise;Balance training;Neuromuscular re-education;Patient/family education;Manual techniques;Dry  needling;Taping;Spinal Manipulations;Joint Manipulations    PT Next Visit Plan  review and update HEP; work on lumbar flexibility, general functoinal strength and conditioning, balance. Leaves for [place] 09/01/18 so we need to work her hard!    PT Home Exercise Plan  Eval: bridges, seated clams red TB, DKTC, SLS    Consulted and Agree with Plan of Care  Patient       Patient will benefit from skilled therapeutic intervention in order to improve the following deficits and impairments:  Decreased range of motion, Decreased activity tolerance, Pain, Decreased balance, Improper body mechanics, Decreased mobility, Decreased strength, Postural dysfunction  Visit Diagnosis: 1. Chronic bilateral low back pain without sciatica   2. Muscle weakness (generalized)   3. Unsteadiness on  feet        Problem List Patient Active Problem List   Diagnosis Date Noted  . Stage I breast cancer, left ([place]) 11/02/2016    Deniece Ree PT, DPT, CBIS  Supplemental Physical Therapist [place]    Pager [phone] Acute Rehab Office [place] [address] [place], [place], [address] Phone: [phone]   Fax:  [phone]  Name: RICHELL CORKER MRN: [id number] Date of Birth: 07-14-1945

## 2018-08-12 ENCOUNTER — Ambulatory Visit (HOSPITAL_COMMUNITY): Payer: Medicare Other

## 2018-08-12 ENCOUNTER — Encounter (HOSPITAL_COMMUNITY): Payer: Self-pay

## 2018-08-12 ENCOUNTER — Other Ambulatory Visit: Payer: Self-pay

## 2018-08-12 DIAGNOSIS — G8929 Other chronic pain: Secondary | ICD-10-CM

## 2018-08-12 DIAGNOSIS — M6281 Muscle weakness (generalized): Secondary | ICD-10-CM | POA: Diagnosis not present

## 2018-08-12 DIAGNOSIS — R2681 Unsteadiness on feet: Secondary | ICD-10-CM | POA: Diagnosis not present

## 2018-08-12 DIAGNOSIS — M545 Low back pain, unspecified: Secondary | ICD-10-CM

## 2018-08-12 NOTE — Patient Instructions (Addendum)
Abduction: Clam (Eccentric) - Side-Lying    Lie on side with knees bent. Lift top knee, keeping feet together. Keep trunk steady. Slowly lower for 3-5 seconds. 10 reps per set, 2 sets per day, at least 4 days per week. Add theraband resistance tied around knees.  http://ecce.exer.us/65   Copyright  VHI. All rights reserved.   Functional Quadriceps: Sit to Stand    Sit on edge of chair, feet flat on floor. Stand upright, extending knees fully. Repeat 10 times per set. Do 2  sets per session.  http://orth.exer.us/735   Copyright  VHI. All rights reserved.   ABDUCTION: Standing (Active)    Stand, feet flat. Lift right leg out to side. Keep toes pointed forward and stand tall. Complete 10 repetitions. Perform 4 sessions per day.  http://gtsc.exer.us/111   Copyright  VHI. All rights reserved.

## 2018-08-12 NOTE — Therapy (Signed)
[place], [place], [address] Phone: [phone]   Fax:  [phone]  Physical Therapy Treatment  Patient Details  Name: Lauren [name] MRN: [id number] Date of Birth: Jul 30, 1945 Referring Provider (PT): Asencion Noble    Encounter Date: 08/12/2018  PT End of Session - 08/12/18 1129    Visit Number  2    Number of Visits  10    Date for PT Re-Evaluation  08/30/18    Authorization Type  Medicare and Generic [employer]    Authorization Time Period  08/11/18 to 08/30/18    Authorization - Visit Number  2    Authorization - Number of Visits  10    PT Start Time  0092    PT Stop Time  1158    PT Time Calculation (min)  41 min    Activity Tolerance  Patient tolerated treatment well    Behavior During Therapy  [facility] for tasks assessed/performed       History reviewed. No pertinent past medical history.  History reviewed. No pertinent surgical history.  There were no vitals filed for this visit.  Subjective Assessment - 08/12/18 1121    Subjective  Pt reports she is pain free, reports some discomfort earlier this morning.  Reports she continues to walk 30 minutes every morning.    Currently in Pain?  No/denies                       [facility] Adult PT Treatment/Exercise - 08/12/18 0001      Exercises   Exercises  Lumbar      Lumbar Exercises: Stretches   Double Knee to Chest Stretch  5 reps;10 seconds      Lumbar Exercises: Standing   Other Standing Lumbar Exercises  hip abduction 10x    Other Standing Lumbar Exercises  SLS Rt 24", Lt 30"      Lumbar Exercises: Seated   Sit to Stand  10 reps    Sit to Stand Limitations  no HHA, eccentric control      Lumbar Exercises: Supine   Bridge  10 reps;3 seconds    Bridge Limitations  2 sets      Lumbar Exercises: Sidelying   Clam  10 reps;5 seconds    Clam Limitations  RTB             PT Education - 08/12/18 1130    Education Details  Reviewed  goals and assured complaince wiht HEP.  Pt educated on benefits with compliance with HEP    Person(s) Educated  Patient    Methods  Explanation;Demonstration    Comprehension  Verbalized understanding;Returned demonstration       PT Short Term Goals - 08/11/18 1223      PT SHORT TERM GOAL #1   Title  Patient to show improvement of at least 1 MMT grade in all tested muscles in order to show improvement in functional strength    Time  3    Period  Weeks    Status  New    Target Date  09/01/18      PT SHORT TERM GOAL #2   Title  Patient to improve score on DGI by at least 3 points in order to show reduced fall risk    Time  3    Period  Weeks    Status  New      PT SHORT TERM GOAL #3   Title  Patient to improve 5x sit to stand time to 12 seconds or less in order to show improved functional strength and mobility    Time  3    Period  Weeks    Status  New      PT SHORT TERM GOAL #4   Title  Patient to show full ROM in lumbar spine in all directions in order to reduce stiffness and general irritation in the region    Time  3    Period  Weeks    Status  New      PT SHORT TERM GOAL #5   Title  Patient to be compliant with advanced HEP for long term self-management movng forward    Time  3    Period  Weeks    Status  New        PT Long Term Goals - 08/11/18 1227      PT LONG TERM GOAL #1   Title  No LTGs appropriate at this time- will update PRN            Plan - 08/12/18 1204    Clinical Impression Statement  Reviewed goals and compliance with HEP, pt able to recall and demonstrate appropriate mechanics with all exericses.  Pt reports she has been performing crunchs daily, reviewed form and recommend she hold off this exercise until able to perform with correct mechanics.  Session focus on gluteal strengthening with additional exercises added to HEP.  Pt given printout and folder to carry HEP, able to demonstrate and verbalize understanding wiht new exercises.  No  reports of pain, was limited by fatigue.    Personal Factors and Comorbidities  Age;Fitness;Past/Current Experience;Comorbidity 1;Sex;Time since onset of injury/illness/exacerbation    Examination-Activity Limitations  Bathing;Locomotion Level;Transfers;Bed Mobility;Reach Overhead;Bend;Sit;Carry;Sleep;Squat;Stairs;Stand;Lift    Examination-Participation Restrictions  Yard Work;Cleaning;Community Activity;Driving;Interpersonal Relationship;Volunteer;Shop;Laundry    Stability/Clinical Decision Making  Stable/Uncomplicated    Clinical Decision Making  Low    Rehab Potential  Good    PT Frequency  3x / week    PT Duration  --   Until she leaves for [place] on 09/01/18   PT Treatment/Interventions  ADLs/Self Care Home Management;Biofeedback;Cryotherapy;Electrical Stimulation;Iontophoresis 4mg /ml Dexamethasone;Moist Heat;Traction;Ultrasound;Gait training;Stair training;Functional mobility training;Therapeutic activities;Therapeutic exercise;Balance training;Neuromuscular re-education;Patient/family education;Manual techniques;Dry needling;Taping;Spinal Manipulations;Joint Manipulations    PT Next Visit Plan  F/U wiht HEP compliance.  Next sessoin progress standing exercises with additional squats, tandem stance and lunges.  General functional strength and conditioning, balalnce work.  Leaves for [place] 09/01/18 so progress every session.    PT Home Exercise Plan  Eval: bridges, seated clams red TB, DKTC, SLS; 7/31: STS, S/L clam RTB, standing abduction       Patient will benefit from skilled therapeutic intervention in order to improve the following deficits and impairments:  Decreased range of motion, Decreased activity tolerance, Pain, Decreased balance, Improper body mechanics, Decreased mobility, Decreased strength, Postural dysfunction  Visit Diagnosis: 1. Chronic bilateral low back pain without sciatica   2. Muscle weakness (generalized)   3. Unsteadiness on feet        Problem  List Patient Active Problem List   Diagnosis Date Noted  . Stage I breast cancer, left [facility]) 11/02/2016   Ihor Austin, [place]; [place]  Aldona Lento 08/12/2018, 12:20 PM  [place] [address] [place], [place], [address] Phone: [phone]   Fax:  [phone]  Name: Lauren [name] MRN: [id number] Date of Birth: [birth year]

## 2018-08-16 ENCOUNTER — Other Ambulatory Visit: Payer: Self-pay

## 2018-08-16 ENCOUNTER — Ambulatory Visit (HOSPITAL_COMMUNITY): Payer: Medicare Other | Attending: Internal Medicine | Admitting: Physical Therapy

## 2018-08-16 DIAGNOSIS — G8929 Other chronic pain: Secondary | ICD-10-CM

## 2018-08-16 DIAGNOSIS — M6281 Muscle weakness (generalized): Secondary | ICD-10-CM

## 2018-08-16 DIAGNOSIS — M545 Low back pain: Secondary | ICD-10-CM | POA: Insufficient documentation

## 2018-08-16 DIAGNOSIS — R2681 Unsteadiness on feet: Secondary | ICD-10-CM | POA: Diagnosis not present

## 2018-08-16 NOTE — Therapy (Signed)
[place], [place], [address] Phone: [phone]   Fax:  [phone]  Physical Therapy Treatment  Patient Details  Name: Lauren [name] MRN: [id number] Date of Birth: December 22, 1945 Referring Provider (PT): Asencion Noble    Encounter Date: 08/16/2018  PT End of Session - 08/16/18 1145    Visit Number  3    Number of Visits  10    Date for PT Re-Evaluation  08/30/18    Authorization Type  Medicare and Generic [employer]    Authorization Time Period  08/11/18 to 08/30/18    Authorization - Visit Number  3    Authorization - Number of Visits  10    PT Start Time  0935    PT Stop Time  1025    PT Time Calculation (min)  50 min    Activity Tolerance  Patient tolerated treatment well    Behavior During Therapy  [facility] for tasks assessed/performed       No past medical history on file.  No past surgical history on file.  There were no vitals filed for this visit.  Subjective Assessment - 08/16/18 0938    Subjective  pt states she's generally in discomfort when she first gets out of the bed but diminishes after she begins to move around.  Currenlty without pain.  STates she can tell the exercises are helping.    Currently in Pain?  No/denies                       [facility] Adult PT Treatment/Exercise - 08/16/18 0001      Lumbar Exercises: Standing   Scapular Retraction  10 reps;Both;Theraband    Theraband Level (Scapular Retraction)  Level 2 (Red)    Row  10 reps;Theraband;Both    Theraband Level (Row)  Level 2 (Red)    Shoulder Extension  10 reps;Theraband;Both    Theraband Level (Shoulder Extension)  Level 2 (Red)    Other Standing Lumbar Exercises  hip abduction 10x   using 4" step to elevate LE     Lumbar Exercises: Seated   Sit to Stand  10 reps    Sit to Stand Limitations  no HHA, eccentric control      Lumbar Exercises: Sidelying   Clam  5 seconds;15 reps    Clam Limitations  RTB              PT Education - 08/16/18 0940    Education Details  educated on footwear; encouraged to wear closed heel shoes to increase safety and so we can challenge in therapy (pt wearing flip flops) pt verbalized understanding.    Person(s) Educated  Patient    Methods  Explanation    Comprehension  Verbalized understanding       PT Short Term Goals - 08/11/18 1223      PT SHORT TERM GOAL #1   Title  Patient to show improvement of at least 1 MMT grade in all tested muscles in order to show improvement in functional strength    Time  3    Period  Weeks    Status  New    Target Date  09/01/18      PT SHORT TERM GOAL #2   Title  Patient to improve score on DGI by at least 3 points in order to show reduced fall risk    Time  3    Period  Weeks  Status  New      PT SHORT TERM GOAL #3   Title  Patient to improve 5x sit to stand time to 12 seconds or less in order to show improved functional strength and mobility    Time  3    Period  Weeks    Status  New      PT SHORT TERM GOAL #4   Title  Patient to show full ROM in lumbar spine in all directions in order to reduce stiffness and general irritation in the region    Time  3    Period  Weeks    Status  New      PT SHORT TERM GOAL #5   Title  Patient to be compliant with advanced HEP for long term self-management movng forward    Time  3    Period  Weeks    Status  New        PT Long Term Goals - 08/11/18 1227      PT LONG TERM GOAL #1   Title  No LTGs appropriate at this time- will update PRN            Plan - 08/16/18 1148    Clinical Impression Statement  contiued with established therex with addition of postural strengthening using theraband.  Pt able to complete with minimal cues and good form/control.  Given instructions and band to complete these at home.  Increased difficutly of static SLS using airex pad and added tandem stance on airex all with intermittent fingertip assist to correct LOB. Added hip  extension for gluteal strenghtening and instructed to complete on riser due to pt reporting diffiuclty sliding LE out without substituting with body to lift it.  Added squats, lunges on airex and  tandem stance per last treatment plan.   Pt without any issues or questions at end of session today.    Personal Factors and Comorbidities  Age;Fitness;Past/Current Experience;Comorbidity 1;Sex;Time since onset of injury/illness/exacerbation    Examination-Activity Limitations  Bathing;Locomotion Level;Transfers;Bed Mobility;Reach Overhead;Bend;Sit;Carry;Sleep;Squat;Stairs;Stand;Lift    Examination-Participation Restrictions  Yard Work;Cleaning;Community Activity;Driving;Interpersonal Relationship;Volunteer;Shop;Laundry    Stability/Clinical Decision Making  Stable/Uncomplicated    Rehab Potential  Good    PT Frequency  3x / week    PT Duration  --   Until she leaves for [place] on 09/01/18   PT Treatment/Interventions  ADLs/Self Care Home Management;Biofeedback;Cryotherapy;Electrical Stimulation;Iontophoresis 4mg /ml Dexamethasone;Moist Heat;Traction;Ultrasound;Gait training;Stair training;Functional mobility training;Therapeutic activities;Therapeutic exercise;Balance training;Neuromuscular re-education;Patient/family education;Manual techniques;Dry needling;Taping;Spinal Manipulations;Joint Manipulations    PT Next Visit Plan  General functional strength and conditioning, balalnce work.  Leaves for [place] 09/01/18 so progress every session.    PT Home Exercise Plan  Eval: bridges, seated clams red TB, DKTC, SLS; 7/31: STS, S/L clam RTB, standing abduction  8/4: theraband postural strengthening rows, retraction, extension RTB       Patient will benefit from skilled therapeutic intervention in order to improve the following deficits and impairments:  Decreased range of motion, Decreased activity tolerance, Pain, Decreased balance, Improper body mechanics, Decreased mobility, Decreased strength, Postural  dysfunction  Visit Diagnosis: No diagnosis found.     Problem List Patient Active Problem List   Diagnosis Date Noted  . Stage I breast cancer, left ([place]) 11/02/2016   Teena Irani, PTA/CLT [phone]  Teena Irani 08/16/2018, 12:31 PM  [place], [place], [address] Phone: [phone]   Fax:  [phone]  Name: Lauren [name] MRN: [id number] Date of  Birth: 09/26/1945

## 2018-08-17 ENCOUNTER — Encounter (HOSPITAL_COMMUNITY): Payer: Self-pay | Admitting: Physical Therapy

## 2018-08-17 ENCOUNTER — Ambulatory Visit (HOSPITAL_COMMUNITY): Payer: Medicare Other | Admitting: Physical Therapy

## 2018-08-17 DIAGNOSIS — G8929 Other chronic pain: Secondary | ICD-10-CM | POA: Diagnosis not present

## 2018-08-17 DIAGNOSIS — M545 Low back pain, unspecified: Secondary | ICD-10-CM

## 2018-08-17 DIAGNOSIS — R2681 Unsteadiness on feet: Secondary | ICD-10-CM | POA: Diagnosis not present

## 2018-08-17 DIAGNOSIS — M6281 Muscle weakness (generalized): Secondary | ICD-10-CM

## 2018-08-17 NOTE — Therapy (Signed)
[place], [place], [address] Phone: [phone]   Fax:  [phone]  Physical Therapy Treatment  Patient Details  Name: Lauren [name] MRN: [id number] Date of Birth: [birth year] Referring Provider (PT): Asencion Noble    Encounter Date: 08/17/2018  PT End of Session - 08/17/18 1401    Visit Number  4    Number of Visits  10    Date for PT Re-Evaluation  08/30/18    Authorization Type  Medicare and Generic [employer]    Authorization Time Period  08/11/18 to 08/30/18    Authorization - Visit Number  4    Authorization - Number of Visits  10    PT Start Time  1027    PT Stop Time  1400    PT Time Calculation (min)  38 min    Activity Tolerance  Patient tolerated treatment well    Behavior During Therapy  [facility] for tasks assessed/performed       History reviewed. No pertinent past medical history.  History reviewed. No pertinent surgical history.  There were no vitals filed for this visit.  Subjective Assessment - 08/17/18 1325    Subjective  I continue to have pain when I get out of bed but it goes away as soon as I get moving. I felt very sore after last session and I am still helping my sister move plus my own housework and yardwork.    Currently in Pain?  No/denies                       [facility] Adult PT Treatment/Exercise - 08/17/18 0001      Exercises   Exercises  Knee/Hip      Lumbar Exercises: Standing   Heel Raises  15 reps    Heel Raises Limitations  heel and toe       Knee/Hip Exercises: Standing   Forward Lunges  Both;1 set;10 reps    Forward Lunges Limitations  6 inch step     Side Lunges  Both;1 set;10 reps    Side Lunges Limitations  6 inch step signifcant difficulty L LE     Lateral Step Up  Both;1 set;10 reps;Hand Hold: 0;Step Height: 8"    Forward Step Up  Both;1 set;10 reps;Hand Hold: 0;Step Height: 8"    Step Down  Both;1 set;10 reps;Step Height: 4"    Other Standing Knee  Exercises  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           Balance Exercises - 08/17/18 1348      Balance Exercises: Standing   Standing Eyes Opened  Narrow base of support (BOS);Foam/compliant surface;2 reps;Time   60 seconds horizontal and vertical head turns x2    Tandem Stance  Eyes open;Foam/compliant surface;3 reps;15 secs    SLS  Eyes open;Solid surface;2 reps;30 secs    Tandem Gait  Forward;Retro;2 reps   in // bars        PT Education - 08/17/18 1401    Education Details  exercise form    Person(s) Educated  Patient    Methods  Explanation    Comprehension  Verbalized understanding       PT Short Term Goals - 08/11/18 1223      PT SHORT TERM GOAL #1   Title  Patient to show improvement of at least 1 MMT grade in all tested muscles in order to show improvement in functional strength  Time  3    Period  Weeks    Status  New    Target Date  09/01/18      PT SHORT TERM GOAL #2   Title  Patient to improve score on DGI by at least 3 points in order to show reduced fall risk    Time  3    Period  Weeks    Status  New      PT SHORT TERM GOAL #3   Title  Patient to improve 5x sit to stand time to 12 seconds or less in order to show improved functional strength and mobility    Time  3    Period  Weeks    Status  New      PT SHORT TERM GOAL #4   Title  Patient to show full ROM in lumbar spine in all directions in order to reduce stiffness and general irritation in the region    Time  3    Period  Weeks    Status  New      PT SHORT TERM GOAL #5   Title  Patient to be compliant with advanced HEP for long term self-management movng forward    Time  3    Period  Weeks    Status  New        PT Long Term Goals - 08/11/18 1227      PT LONG TERM GOAL #1   Title  No LTGs appropriate at this time- will update PRN            Plan - 08/17/18 1402    Clinical Impression Statement  Continued working on functional strength and conditioning as well as balance  this session. Patient continues to progress well with skilled PT Services and has been able to tolerate progressions well each session, also has been compliant with HEP. Curiously, with standing strength based tasks, she demonstrates considerable weakness L LE and difficulty with task performance, will continue ongoing skilled intervention and cues moving forward.    Personal Factors and Comorbidities  Age;Fitness;Past/Current Experience;Comorbidity 1;Sex;Time since onset of injury/illness/exacerbation    Examination-Activity Limitations  Bathing;Locomotion Level;Transfers;Bed Mobility;Reach Overhead;Bend;Sit;Carry;Sleep;Squat;Stairs;Stand;Lift    Examination-Participation Restrictions  Yard Work;Cleaning;Community Activity;Driving;Interpersonal Relationship;Volunteer;Shop;Laundry    Rehab Potential  Good    PT Frequency  3x / week    PT Duration  --   until she leaves for [place] 09/01/18   PT Treatment/Interventions  ADLs/Self Care Home Management;Biofeedback;Cryotherapy;Electrical Stimulation;Iontophoresis 4mg /ml Dexamethasone;Moist Heat;Traction;Ultrasound;Gait training;Stair training;Functional mobility training;Therapeutic activities;Therapeutic exercise;Balance training;Neuromuscular re-education;Patient/family education;Manual techniques;Dry needling;Taping;Spinal Manipulations;Joint Manipulations    PT Next Visit Plan  General functional strength and conditioning, balalnce work.  Leaves for [place] 09/01/18 so progress every session.    PT Home Exercise Plan  Eval: bridges, seated clams red TB, DKTC, SLS; 7/31: STS, S/L clam RTB, standing abduction  8/4: theraband postural strengthening rows, retraction, extension RTB    Consulted and Agree with Plan of Care  Patient       Patient will benefit from skilled therapeutic intervention in order to improve the following deficits and impairments:  Decreased range of motion, Decreased activity tolerance, Pain, Decreased balance, Improper body  mechanics, Decreased mobility, Decreased strength, Postural dysfunction  Visit Diagnosis: 1. Chronic bilateral low back pain without sciatica   2. Muscle weakness (generalized)   3. Unsteadiness on feet        Problem List Patient Active Problem List   Diagnosis Date Noted  . Stage I breast cancer, left (  [place]) 11/02/2016    Deniece Ree PT, DPT, CBIS  Supplemental Physical Therapist [facility]    Pager [phone] Acute Rehab Office [place] [address] [place], [place], [address] Phone: [phone]   Fax:  [phone]  Name: SIBBIE FLAMMIA MRN: [id number] Date of Birth: 09-07-1945

## 2018-08-18 DIAGNOSIS — L400 Psoriasis vulgaris: Secondary | ICD-10-CM | POA: Diagnosis not present

## 2018-08-18 DIAGNOSIS — D225 Melanocytic nevi of trunk: Secondary | ICD-10-CM | POA: Diagnosis not present

## 2018-08-18 DIAGNOSIS — X32XXXD Exposure to sunlight, subsequent encounter: Secondary | ICD-10-CM | POA: Diagnosis not present

## 2018-08-18 DIAGNOSIS — L570 Actinic keratosis: Secondary | ICD-10-CM | POA: Diagnosis not present

## 2018-08-18 DIAGNOSIS — Z1283 Encounter for screening for malignant neoplasm of skin: Secondary | ICD-10-CM | POA: Diagnosis not present

## 2018-08-19 ENCOUNTER — Ambulatory Visit (HOSPITAL_COMMUNITY): Payer: Medicare Other

## 2018-08-19 ENCOUNTER — Other Ambulatory Visit: Payer: Self-pay

## 2018-08-19 ENCOUNTER — Encounter (HOSPITAL_COMMUNITY): Payer: Self-pay

## 2018-08-19 DIAGNOSIS — M545 Low back pain: Secondary | ICD-10-CM | POA: Diagnosis not present

## 2018-08-19 DIAGNOSIS — M6281 Muscle weakness (generalized): Secondary | ICD-10-CM | POA: Diagnosis not present

## 2018-08-19 DIAGNOSIS — R2681 Unsteadiness on feet: Secondary | ICD-10-CM

## 2018-08-19 DIAGNOSIS — G8929 Other chronic pain: Secondary | ICD-10-CM | POA: Diagnosis not present

## 2018-08-19 NOTE — Therapy (Signed)
[place], [place], [address] Phone: [phone]   Fax:  [phone]  Physical Therapy Treatment  Patient Details  Name: Lauren [name] MRN: [id number] Date of Birth: [birth year] Referring Provider (PT): Asencion Noble    Encounter Date: 08/19/2018  PT End of Session - 08/19/18 1317    Visit Number  5    Number of Visits  10    Date for PT Re-Evaluation  08/30/18    Authorization Type  Medicare and Generic [employer]    Authorization Time Period  08/11/18 to 08/30/18    Authorization - Visit Number  5    Authorization - Number of Visits  10    PT Start Time  6195    PT Stop Time  1354    PT Time Calculation (min)  38 min    Activity Tolerance  Patient tolerated treatment well    Behavior During Therapy  [facility] for tasks assessed/performed       History reviewed. No pertinent past medical history.  History reviewed. No pertinent surgical history.  There were no vitals filed for this visit.  Subjective Assessment - 08/19/18 1317    Subjective  Continues to have some discomfort laying in bed, no reports of pain currently.    Patient Stated Goals  build back strength, work on posture, balance    Currently in Pain?  No/denies                       [facility] Adult PT Treatment/Exercise - 08/19/18 0001      Exercises   Exercises  Lumbar      Lumbar Exercises: Standing   Heel Raises  20 reps    Heel Raises Limitations  heel and toe on slope    Functional Squats  15 reps    Functional Squats Limitations  cueing for mechanics    Forward Lunge  15 reps    Forward Lunge Limitations  on floor    Side Lunge  10 reps    Side Lunge Limitations  on floor; cueing for knee mechanics iwht Lt LE    Other Standing Lumbar Exercises  5RT reciprocal pattern Lt HR          Balance Exercises - 08/19/18 1335      Balance Exercises: Standing   SLS  Eyes open;Solid surface;3 reps   Lt 31", Rt 24" max of 3   SLS with  Vectors  3 reps;Solid surface   3x 5" BLE intermittent HHA   Balance Beam  Tandem and retro gait 2RT with SBA    Tandem Gait  Forward;1 rep    Sidestepping  2 reps;Theraband   RTB         PT Short Term Goals - 08/11/18 1223      PT SHORT TERM GOAL #1   Title  Patient to show improvement of at least 1 MMT grade in all tested muscles in order to show improvement in functional strength    Time  3    Period  Weeks    Status  New    Target Date  09/01/18      PT SHORT TERM GOAL #2   Title  Patient to improve score on DGI by at least 3 points in order to show reduced fall risk    Time  3    Period  Weeks    Status  New      PT  SHORT TERM GOAL #3   Title  Patient to improve 5x sit to stand time to 12 seconds or less in order to show improved functional strength and mobility    Time  3    Period  Weeks    Status  New      PT SHORT TERM GOAL #4   Title  Patient to show full ROM in lumbar spine in all directions in order to reduce stiffness and general irritation in the region    Time  3    Period  Weeks    Status  New      PT SHORT TERM GOAL #5   Title  Patient to be compliant with advanced HEP for long term self-management movng forward    Time  3    Period  Weeks    Status  New        PT Long Term Goals - 08/11/18 1227      PT LONG TERM GOAL #1   Title  No LTGs appropriate at this time- will update PRN            Plan - 08/19/18 1406    Clinical Impression Statement  Continued with established POC for functional strengthening and high level balance training.  Added glut med strengthening exercises to assist wiht balance including sidestep wiht resistance, vector stance and lunges.  Min cueing for mechanics with new exercises.  Able to progress to dynamic surface with tandem/retro gait with ability to recover LOB I.  Continues to have increased difficutly with activities with Lt LE compared to Rt.  Reviewed compliance with HEP with additional printout to added  balance and strengthening to HEP.    Personal Factors and Comorbidities  Age;Fitness;Past/Current Experience;Comorbidity 1;Sex;Time since onset of injury/illness/exacerbation    Examination-Activity Limitations  Bathing;Locomotion Level;Transfers;Bed Mobility;Reach Overhead;Bend;Sit;Carry;Sleep;Squat;Stairs;Stand;Lift    Examination-Participation Restrictions  Yard Work;Cleaning;Community Activity;Driving;Interpersonal Relationship;Volunteer;Shop;Laundry    Stability/Clinical Decision Making  Stable/Uncomplicated    Clinical Decision Making  Low    Rehab Potential  Good    PT Frequency  3x / week    PT Duration  --   Until she leaves for [place] 09/01/18   PT Treatment/Interventions  ADLs/Self Care Home Management;Biofeedback;Cryotherapy;Electrical Stimulation;Iontophoresis 4mg /ml Dexamethasone;Moist Heat;Traction;Ultrasound;Gait training;Stair training;Functional mobility training;Therapeutic activities;Therapeutic exercise;Balance training;Neuromuscular re-education;Patient/family education;Manual techniques;Dry needling;Taping;Spinal Manipulations;Joint Manipulations    PT Next Visit Plan  General functional strength and conditioning, balalnce work.  Leaves for [place] 09/01/18 so progress every session.    PT Home Exercise Plan  Eval: bridges, seated clams red TB, DKTC, SLS; 7/31: STS, S/L clam RTB, standing abduction  8/4: theraband postural strengthening rows, retraction, extension RTB; 08/19/18: tandem stance/gait; lunge and squat       Patient will benefit from skilled therapeutic intervention in order to improve the following deficits and impairments:  Decreased range of motion, Decreased activity tolerance, Pain, Decreased balance, Improper body mechanics, Decreased mobility, Decreased strength, Postural dysfunction  Visit Diagnosis: 1. Chronic bilateral low back pain without sciatica   2. Muscle weakness (generalized)   3. Unsteadiness on feet        Problem List Patient Active  Problem List   Diagnosis Date Noted  . Stage I breast cancer, left [facility]) 11/02/2016   Ihor Austin, LPTA; [place]  Aldona Lento 08/19/2018, 2:55 PM  [place] [address] [place], [place], [address] Phone: [phone]   Fax:  [phone]  Name: Lauren [name] MRN: [id number] Date of Birth: 06/11/1945

## 2018-08-19 NOTE — Patient Instructions (Addendum)
Tandem Stance    Right foot in front of left, heel touching toe both feet "straight ahead". Stand on Foot Triangle of Support with both feet. Balance in this position 30 seconds. Do with left foot in front of right.  Copyright  VHI. All rights reserved.   Feet Heel-Toe "Tandem"    Arms outstretched, walk a straight line bringing one foot directly in front of the other. Down long hallway going forward and backward.    Copyright  VHI. All rights reserved.   Forward Lunge    Standing with feet shoulder width apart and stomach tight, step forward with left leg. Repeat 15 times per set. Do 2 sets per day.   http://orth.exer.us/1147   Copyright  VHI. All rights reserved.   FUNCTIONAL MOBILITY: Squat    Stance: shoulder-width on floor. Bend hips and knees. Keep back straight. Do not allow knees to bend past toes. Squeeze glutes and quads to stand. 10 reps per set, 2 sets per day.  Copyright  VHI. All rights reserved.

## 2018-08-22 ENCOUNTER — Ambulatory Visit (HOSPITAL_COMMUNITY): Payer: Medicare Other | Admitting: Physical Therapy

## 2018-08-22 ENCOUNTER — Other Ambulatory Visit: Payer: Self-pay

## 2018-08-22 ENCOUNTER — Encounter (HOSPITAL_COMMUNITY): Payer: Self-pay | Admitting: Physical Therapy

## 2018-08-22 DIAGNOSIS — G8929 Other chronic pain: Secondary | ICD-10-CM | POA: Diagnosis not present

## 2018-08-22 DIAGNOSIS — R2681 Unsteadiness on feet: Secondary | ICD-10-CM

## 2018-08-22 DIAGNOSIS — M545 Low back pain: Secondary | ICD-10-CM | POA: Diagnosis not present

## 2018-08-22 DIAGNOSIS — M6281 Muscle weakness (generalized): Secondary | ICD-10-CM | POA: Diagnosis not present

## 2018-08-22 NOTE — Therapy (Signed)
[place], [place], [address] Phone: [phone]   Fax:  [phone]  Physical Therapy Treatment  Patient Details  Name: Lauren [name] MRN: [id number] Date of Birth: 29-Aug-1945 Referring Provider (PT): Asencion Noble    Encounter Date: 08/22/2018  PT End of Session - 08/22/18 0959    Visit Number  6    Number of Visits  10    Date for PT Re-Evaluation  08/30/18    Authorization Type  Medicare and Generic [employer]    Authorization Time Period  08/11/18 to 08/30/18    Authorization - Visit Number  6    Authorization - Number of Visits  10    PT Start Time  0918    PT Stop Time  0957    PT Time Calculation (min)  39 min    Activity Tolerance  Patient tolerated treatment well    Behavior During Therapy  [facility] for tasks assessed/performed       History reviewed. No pertinent past medical history.  History reviewed. No pertinent surgical history.  There were no vitals filed for this visit.  Subjective Assessment - 08/22/18 0920    Subjective  Nothing new, I felt good after last session. I feel really good about where I am, if I keep up with the exercises I think I'll be OK. I had some pain this morning when I first get up but it went away when I walked.    Patient Stated Goals  build back strength, work on posture, balance    Currently in Pain?  No/denies                       [facility] Adult PT Treatment/Exercise - 08/22/18 0001      Knee/Hip Exercises: Standing   Heel Raises  Both;1 set;20 reps    Heel Raises Limitations  heel/toe     Hip Abduction  Both;1 set;10 reps    Abduction Limitations  with hip hike     Lateral Step Up  Both;1 set;15 reps;Hand Hold: 0;Step Height: 8"    Forward Step Up  Both;1 set;15 reps;Hand Hold: 0;Step Height: 8"    Functional Squat  1 set;10 reps;3 seconds    Functional Squat Limitations  in front of chair for form     Other Standing Knee Exercises  hip hikes with and  without swing 1x10 B     Other Standing Knee Exercises  deadlifts 0# dowel for form training 1x15           Balance Exercises - 08/22/18 0942      Balance Exercises: Standing   Tandem Stance  Eyes open;Foam/compliant surface;4 reps;15 secs   black foam pad    Tandem Gait  Forward;3 reps;Other (comment)   forward and back in // bars dual tasking    Retro Gait  3 reps;Other (comment)   in // bars dual tasking    Marching Limitations  1x20 on black foam pad     Heel Raises Limitations  1x20 on black foam pad     Other Standing Exercises  tandem stance solid surface with head turns 2x30 seconds B         PT Education - 08/22/18 0959    Education Details  exercise form, benefits of deadlifts and squats for back health    Person(s) Educated  Patient    Methods  Explanation    Comprehension  Verbalized understanding  PT Short Term Goals - 08/11/18 1223      PT SHORT TERM GOAL #1   Title  Patient to show improvement of at least 1 MMT grade in all tested muscles in order to show improvement in functional strength    Time  3    Period  Weeks    Status  New    Target Date  09/01/18      PT SHORT TERM GOAL #2   Title  Patient to improve score on DGI by at least 3 points in order to show reduced fall risk    Time  3    Period  Weeks    Status  New      PT SHORT TERM GOAL #3   Title  Patient to improve 5x sit to stand time to 12 seconds or less in order to show improved functional strength and mobility    Time  3    Period  Weeks    Status  New      PT SHORT TERM GOAL #4   Title  Patient to show full ROM in lumbar spine in all directions in order to reduce stiffness and general irritation in the region    Time  3    Period  Weeks    Status  New      PT SHORT TERM GOAL #5   Title  Patient to be compliant with advanced HEP for long term self-management movng forward    Time  3    Period  Weeks    Status  New        PT Long Term Goals - 08/11/18 1227      PT  LONG TERM GOAL #1   Title  No LTGs appropriate at this time- will update PRN            Plan - 08/22/18 0959    Clinical Impression Statement  Education provided about benefit of exercises in relation to back pain, continued to encourage patient to remain compliant with all exercises/activities after going to the [place]. Continued to progress functional strength and balance based activities to challenge low back and hip musculature, also further broke down squat and deadlift form for improved activation of glute and lumbar paraspinal musculature, plan to progress next session. Continues to progress well, anticipate she will be ready for DC to advanced home program at end of initial established POC.    Personal Factors and Comorbidities  Age;Fitness;Past/Current Experience;Comorbidity 1;Sex;Time since onset of injury/illness/exacerbation    Examination-Activity Limitations  Bathing;Locomotion Level;Transfers;Bed Mobility;Reach Overhead;Bend;Sit;Carry;Sleep;Squat;Stairs;Stand;Lift    Examination-Participation Restrictions  Yard Work;Cleaning;Community Activity;Driving;Interpersonal Relationship;Volunteer;Shop;Laundry    Stability/Clinical Decision Making  Stable/Uncomplicated    Clinical Decision Making  Low    Rehab Potential  Good    PT Frequency  3x / week    PT Duration  --   until she leaves for [place] 09/01/18   PT Treatment/Interventions  ADLs/Self Care Home Management;Biofeedback;Cryotherapy;Electrical Stimulation;Iontophoresis 4mg /ml Dexamethasone;Moist Heat;Traction;Ultrasound;Gait training;Stair training;Functional mobility training;Therapeutic activities;Therapeutic exercise;Balance training;Neuromuscular re-education;Patient/family education;Manual techniques;Dry needling;Taping;Spinal Manipulations;Joint Manipulations    PT Next Visit Plan  continue progressing strength and balance. Add light weight to deadlifts and squats. No more HEP updates until final session as she has a LOT.     PT Home Exercise Plan  Eval: bridges, seated clams red TB, DKTC, SLS; 7/31: STS, S/L clam RTB, standing abduction  8/4: theraband postural strengthening rows, retraction, extension RTB; 08/19/18: tandem stance/gait; lunge and squat    Consulted and  Agree with Plan of Care  Patient       Patient will benefit from skilled therapeutic intervention in order to improve the following deficits and impairments:  Decreased range of motion, Decreased activity tolerance, Pain, Decreased balance, Improper body mechanics, Decreased mobility, Decreased strength, Postural dysfunction  Visit Diagnosis: 1. Chronic bilateral low back pain without sciatica   2. Muscle weakness (generalized)   3. Unsteadiness on feet        Problem List Patient Active Problem List   Diagnosis Date Noted  . Stage I breast cancer, left ([place]) 11/02/2016    Deniece Ree PT, DPT, CBIS  Supplemental Physical Therapist [place]    Pager [phone] Acute Rehab Office [place] [address] [place], [place], [address] Phone: [phone]   Fax:  [phone]  Name: Lauren [name] MRN: [id number] Date of Birth: 01/05/1946

## 2018-08-23 ENCOUNTER — Other Ambulatory Visit: Payer: Self-pay

## 2018-08-23 DIAGNOSIS — R6889 Other general symptoms and signs: Secondary | ICD-10-CM | POA: Diagnosis not present

## 2018-08-23 DIAGNOSIS — Z20822 Contact with and (suspected) exposure to covid-19: Secondary | ICD-10-CM

## 2018-08-24 ENCOUNTER — Ambulatory Visit (HOSPITAL_COMMUNITY): Payer: Medicare Other

## 2018-08-24 ENCOUNTER — Encounter (HOSPITAL_COMMUNITY): Payer: Self-pay

## 2018-08-24 ENCOUNTER — Other Ambulatory Visit: Payer: Self-pay

## 2018-08-24 DIAGNOSIS — M545 Low back pain, unspecified: Secondary | ICD-10-CM

## 2018-08-24 DIAGNOSIS — M6281 Muscle weakness (generalized): Secondary | ICD-10-CM | POA: Diagnosis not present

## 2018-08-24 DIAGNOSIS — R2681 Unsteadiness on feet: Secondary | ICD-10-CM

## 2018-08-24 DIAGNOSIS — G8929 Other chronic pain: Secondary | ICD-10-CM

## 2018-08-24 LAB — NOVEL CORONAVIRUS, NAA: SARS-CoV-2, NAA: NOT DETECTED

## 2018-08-24 NOTE — Therapy (Signed)
[place], [place], [address] Phone: [phone]   Fax:  [phone]  Physical Therapy Treatment  Patient Details  Name: Lauren [name] MRN: [id number] Date of Birth: [birth year] Referring Provider (PT): Asencion Noble    Encounter Date: 08/24/2018  PT End of Session - 08/24/18 0927    Visit Number  7    Number of Visits  10    Date for PT Re-Evaluation  08/30/18    Authorization Type  Medicare and Generic [employer]    Authorization Time Period  08/11/18 to 08/30/18    Authorization - Visit Number  7    Authorization - Number of Visits  10    PT Start Time  0920    PT Stop Time  1000    PT Time Calculation (min)  40 min    Activity Tolerance  Patient tolerated treatment well    Behavior During Therapy  [facility] for tasks assessed/performed       History reviewed. No pertinent past medical history.  History reviewed. No pertinent surgical history.  There were no vitals filed for this visit.  Subjective Assessment - 08/24/18 0925    Subjective  Pt reports no pain, just some soreness. Pt reports she is being covid-19 tested in order to travel to [place], denies all symptoms and is wearing mask appropriately.    How long can you sit comfortably?  as needed but is hard to get up, will work itself out if I walk    How long can you stand comfortably?  unlimited    How long can you walk comfortably?  helps pain, unlimited    Patient Stated Goals  build back strength, work on posture, balance    Currently in Pain?  No/denies        [facility] Adult PT Treatment/Exercise - 08/24/18 0001      Lumbar Exercises: Supine   Ab Set  20 reps    AB Set Limitations  3-5 sec hold, with kegel    Other Supine Lumbar Exercises  TA with alternating BLE march, x20 reps; TA with alternating LE bent knee fall out, x20 reps; TA with alternating LE extension, x10 reps      Knee/Hip Exercises: Standing   Hip Abduction  Both;10 reps    Abduction  Limitations  RTB    Hip Extension  Both;10 reps    Extension Limitations  RTB    Lateral Step Up  Both;15 reps;Step Height: 8"    Wall Squat  5 reps;5 seconds    Other Standing Knee Exercises  deadlifts, 0# and 5# x10 reps each         PT Education - 08/24/18 0927    Education Details  Exercise technique, updated HEP, d/c next visit    Person(s) Educated  Patient    Methods  Explanation;Demonstration;Handout    Comprehension  Verbalized understanding;Returned demonstration       PT Short Term Goals - 08/24/18 1008      PT SHORT TERM GOAL #1   Title  Patient to show improvement of at least 1 MMT grade in all tested muscles in order to show improvement in functional strength    Time  3    Period  Weeks    Status  On-going    Target Date  09/01/18      PT SHORT TERM GOAL #2   Title  Patient to improve score on DGI by at least 3 points in  order to show reduced fall risk    Time  3    Period  Weeks    Status  On-going      PT SHORT TERM GOAL #3   Title  Patient to improve 5x sit to stand time to 12 seconds or less in order to show improved functional strength and mobility    Time  3    Period  Weeks    Status  On-going      PT SHORT TERM GOAL #4   Title  Patient to show full ROM in lumbar spine in all directions in order to reduce stiffness and general irritation in the region    Time  3    Period  Weeks    Status  On-going      PT SHORT TERM GOAL #5   Title  Patient to be compliant with advanced HEP for long term self-management movng forward    Time  3    Period  Weeks    Status  On-going        PT Long Term Goals - 08/11/18 1227      PT LONG TERM GOAL #1   Title  No LTGs appropriate at this time- will update PRN            Plan - 08/24/18 0927    Clinical Impression Statement  Continued with pt's established POC. Initiated treatment session with focus on TA contraction to improve isometric core strength and reduce back pain. With repeated reps, pt  mastered form and able to progress with alternating LE marches, bent knee fall out and LE extension while maintaining low back on table and no pain complaints. Pt able to progress to weighted deadlifts this date with good form and cues to squeeze glutes to protect back. Updated HEP with core strengthening exercises and printed sheet per pt request. Continue to progress as able.    Personal Factors and Comorbidities  Age;Fitness;Past/Current Experience;Comorbidity 1;Sex;Time since onset of injury/illness/exacerbation    Examination-Activity Limitations  Bathing;Locomotion Level;Transfers;Bed Mobility;Reach Overhead;Bend;Sit;Carry;Sleep;Squat;Stairs;Stand;Lift    Examination-Participation Restrictions  Yard Work;Cleaning;Community Activity;Driving;Interpersonal Relationship;Volunteer;Shop;Laundry    Stability/Clinical Decision Making  Stable/Uncomplicated    Rehab Potential  Good    PT Frequency  3x / week    PT Duration  --   until she leaves for [place] 09/01/18   PT Treatment/Interventions  ADLs/Self Care Home Management;Biofeedback;Cryotherapy;Electrical Stimulation;Iontophoresis 4mg /ml Dexamethasone;Moist Heat;Traction;Ultrasound;Gait training;Stair training;Functional mobility training;Therapeutic activities;Therapeutic exercise;Balance training;Neuromuscular re-education;Patient/family education;Manual techniques;Dry needling;Taping;Spinal Manipulations;Joint Manipulations    PT Next Visit Plan  Discharge, finalize HEP.    PT Home Exercise Plan  Eval: bridges, seated clams red TB, DKTC, SLS; 7/31: STS, S/L clam RTB, standing abduction  8/4: theraband postural strengthening rows, retraction, extension RTB; 08/19/18: tandem stance/gait; lunge and squat; 8/12: TA with exhale, TA with bent leg fall out, marching alternating LE, and LE extension    Consulted and Agree with Plan of Care  Patient       Patient will benefit from skilled therapeutic intervention in order to improve the following deficits  and impairments:  Decreased range of motion, Decreased activity tolerance, Pain, Decreased balance, Improper body mechanics, Decreased mobility, Decreased strength, Postural dysfunction  Visit Diagnosis: 1. Chronic bilateral low back pain without sciatica   2. Muscle weakness (generalized)   3. Unsteadiness on feet        Problem List Patient Active Problem List   Diagnosis Date Noted  . Stage I breast cancer, left ([place]) 11/02/2016  Talbot Grumbling PT, DPT 08/24/18, 10:10 AM [facility] [address] [place], [place], [address] Phone: [phone]   Fax:  [phone]  Name: LIZANIA BOUCHARD MRN: [id number] Date of Birth: 1945/01/25

## 2018-08-29 ENCOUNTER — Ambulatory Visit (HOSPITAL_COMMUNITY): Payer: Medicare Other | Admitting: Physical Therapy

## 2018-08-30 ENCOUNTER — Ambulatory Visit (HOSPITAL_COMMUNITY): Payer: Medicare Other | Admitting: Physical Therapy

## 2018-11-30 ENCOUNTER — Other Ambulatory Visit: Payer: Medicare Other

## 2018-11-30 ENCOUNTER — Ambulatory Visit: Payer: Medicare Other | Admitting: Hematology & Oncology

## 2018-12-02 ENCOUNTER — Other Ambulatory Visit: Payer: Self-pay | Admitting: *Deleted

## 2018-12-02 ENCOUNTER — Other Ambulatory Visit (HOSPITAL_COMMUNITY): Payer: Self-pay | Admitting: Hematology & Oncology

## 2018-12-02 DIAGNOSIS — C50912 Malignant neoplasm of unspecified site of left female breast: Secondary | ICD-10-CM

## 2018-12-02 DIAGNOSIS — Z1231 Encounter for screening mammogram for malignant neoplasm of breast: Secondary | ICD-10-CM

## 2018-12-05 ENCOUNTER — Inpatient Hospital Stay (HOSPITAL_BASED_OUTPATIENT_CLINIC_OR_DEPARTMENT_OTHER): Payer: Medicare Other | Admitting: Hematology & Oncology

## 2018-12-05 ENCOUNTER — Encounter: Payer: Self-pay | Admitting: Hematology & Oncology

## 2018-12-05 ENCOUNTER — Inpatient Hospital Stay: Payer: Medicare Other | Attending: Hematology & Oncology

## 2018-12-05 ENCOUNTER — Other Ambulatory Visit: Payer: Self-pay

## 2018-12-05 VITALS — BP 142/86 | HR 81 | Temp 97.8°F | Resp 18 | Wt 163.0 lb

## 2018-12-05 DIAGNOSIS — C50912 Malignant neoplasm of unspecified site of left female breast: Secondary | ICD-10-CM

## 2018-12-05 DIAGNOSIS — Z853 Personal history of malignant neoplasm of breast: Secondary | ICD-10-CM | POA: Diagnosis not present

## 2018-12-05 DIAGNOSIS — Z9223 Personal history of estrogen therapy: Secondary | ICD-10-CM | POA: Insufficient documentation

## 2018-12-05 DIAGNOSIS — Z170 Estrogen receptor positive status [ER+]: Secondary | ICD-10-CM | POA: Insufficient documentation

## 2018-12-05 DIAGNOSIS — Z923 Personal history of irradiation: Secondary | ICD-10-CM | POA: Insufficient documentation

## 2018-12-05 LAB — CBC WITH DIFFERENTIAL (CANCER CENTER ONLY)
Abs Immature Granulocytes: 0.03 10*3/uL (ref 0.00–0.07)
Basophils Absolute: 0 10*3/uL (ref 0.0–0.1)
Basophils Relative: 1 %
Eosinophils Absolute: 0.1 10*3/uL (ref 0.0–0.5)
Eosinophils Relative: 2 %
HCT: 42.6 % (ref 36.0–46.0)
Hemoglobin: 14.1 g/dL (ref 12.0–15.0)
Immature Granulocytes: 1 %
Lymphocytes Relative: 37 %
Lymphs Abs: 2.2 10*3/uL (ref 0.7–4.0)
MCH: 30.1 pg (ref 26.0–34.0)
MCHC: 33.1 g/dL (ref 30.0–36.0)
MCV: 90.8 fL (ref 80.0–100.0)
Monocytes Absolute: 0.6 10*3/uL (ref 0.1–1.0)
Monocytes Relative: 10 %
Neutro Abs: 3 10*3/uL (ref 1.7–7.7)
Neutrophils Relative %: 49 %
Platelet Count: 216 10*3/uL (ref 150–400)
RBC: 4.69 MIL/uL (ref 3.87–5.11)
RDW: 12.8 % (ref 11.5–15.5)
WBC Count: 6 10*3/uL (ref 4.0–10.5)
nRBC: 0 % (ref 0.0–0.2)

## 2018-12-05 LAB — CMP (CANCER CENTER ONLY)
ALT: 18 U/L (ref 0–44)
AST: 20 U/L (ref 15–41)
Albumin: 4.4 g/dL (ref 3.5–5.0)
Alkaline Phosphatase: 87 U/L (ref 38–126)
Anion gap: 9 (ref 5–15)
BUN: 14 mg/dL (ref 8–23)
CO2: 25 mmol/L (ref 22–32)
Calcium: 9 mg/dL (ref 8.9–10.3)
Chloride: 105 mmol/L (ref 98–111)
Creatinine: 0.63 mg/dL (ref 0.44–1.00)
GFR, Est AFR Am: >60 mL/min (ref >60)
GFR, Estimated: >60 mL/min (ref >60)
Glucose, Bld: 83 mg/dL (ref 70–99)
Potassium: 4.2 mmol/L (ref 3.5–5.1)
Sodium: 139 mmol/L (ref 135–145)
Total Bilirubin: 0.5 mg/dL (ref 0.3–1.2)
Total Protein: 7.4 g/dL (ref 6.5–8.1)

## 2018-12-05 LAB — LACTATE DEHYDROGENASE: LDH: 232 U/L — ABNORMAL HIGH (ref 98–192)

## 2018-12-05 NOTE — Progress Notes (Signed)
Hematology and Oncology Follow Up Visit  Lauren [name] [id number] 1945-09-06 73 y.o. 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  As always, she has been down in the [place].  She actually was stuck in the [place] because she cannot get out due to the coronavirus restrictions.  There is been no problems so far.  She feels well.  She gets a mammogram I think next week.  There is been no problems with fever.  She has had no cough.  There is been no change in bowel or bladder habits.  She and her husband do travel quite a bit.  Have a daughter who lives out in [place].  It sounds like they will have a quiet Thanksgiving.  She has had no leg swelling.  She has had no rashes.  Overall, her performance status is ECOG 0.  Medications:  Allergies as of 12/05/2018   No Known Allergies     Medication List       Accurate as of December 05, 2018 11:49 AM. If you have any questions, ask your nurse or doctor.        STOP taking these medications   Ilevro 0.3 % ophthalmic suspension Generic drug: nepafenac Stopped by: [name], MD     TAKE these medications   atorvastatin 20 MG tablet Commonly known as: LIPITOR Take 20 mg by mouth daily.   glucosamine-chondroitin 500-400 MG tablet Take 1 tablet by mouth every morning.   Magnesium 400 MG Caps Take by mouth every morning.   omeprazole 20 MG capsule Commonly known as: PRILOSEC Take 20 mg by mouth daily.   predniSONE 5 MG tablet Commonly known as: DELTASONE Take 5 mg by mouth daily.   Synthroid 112 MCG tablet Generic drug: levothyroxine Take 112 mcg by mouth daily.   VIACTIV PO Take by mouth. Only with vitamin D       Allergies: No Known Allergies  Past Medical History, Surgical history, Social history, and Family History were reviewed and updated.  Review  of Systems: Review of Systems  Constitutional: Negative.   HENT: Negative.   Eyes: Negative.   Respiratory: Negative.   Cardiovascular: Negative.   Gastrointestinal: Negative.   Genitourinary: Negative.   Musculoskeletal: Negative.   Skin: Negative.   Neurological: Negative.   Endo/Heme/Allergies: Negative.   Psychiatric/Behavioral: Negative.      Physical Exam:  weight is 163 lb (73.9 kg). Her temporal temperature is 97.8 F (36.6 C). Her blood pressure is 142/86 (abnormal) and her pulse is 81. Her respiration is 18 and oxygen saturation is 97%.   Wt Readings from Last 3 Encounters:  12/05/18 163 lb (73.9 kg)  11/29/17 161 lb (73 kg)  11/02/16 166 lb (75.3 kg)    Physical Exam Vitals signs reviewed.  Constitutional:      Comments: Breast exam shows right breast with no masses, edema or erythema there is no right axillary adenopathy.  Left breast is slightly contracted from radiation and surgery.  She has a well-healed lumpectomy at about the 2 o'clock position.  There are some slight firmness at the lumpectomy site.  She has no nipple discharge.  There is no left axillary adenopathy.  HENT:     Head: Normocephalic and atraumatic.  Eyes:     Pupils: Pupils are equal, round, and reactive to light.  Neck:  Musculoskeletal: Normal range of motion.  Cardiovascular:     Rate and Rhythm: Normal rate and regular rhythm.     Heart sounds: Normal heart sounds.  Pulmonary:     Effort: Pulmonary effort is normal.     Breath sounds: Normal breath sounds.  Abdominal:     General: Bowel sounds are normal.     Palpations: Abdomen is soft.  Musculoskeletal: Normal range of motion.        General: No tenderness or deformity.  Lymphadenopathy:     Cervical: No cervical adenopathy.  Skin:    General: Skin is warm and dry.     Findings: No erythema or rash.  Neurological:     Mental Status: She is alert and oriented to person, place, and time.  Psychiatric:        Behavior:  Behavior normal.        Thought Content: Thought content normal.        Judgment: Judgment normal.      Lab Results  Component Value Date   WBC 6.0 12/05/2018   HGB 14.1 12/05/2018   HCT 42.6 12/05/2018   MCV 90.8 12/05/2018   PLT 216 12/05/2018   No results found for: FERRITIN, IRON, TIBC, UIBC, IRONPCTSAT Lab Results  Component Value Date   RBC 4.69 12/05/2018   No results found for: KPAFRELGTCHN, LAMBDASER, KAPLAMBRATIO No results found for: IGGSERUM, IGA, IGMSERUM No results found for: Odetta Pink, SPEI   Chemistry      Component Value Date/Time   NA 139 12/05/2018 0950   NA 143 11/02/2016 1335   NA 141 08/30/2015 1149   K 4.2 12/05/2018 0950   K 4.4 11/02/2016 1335   K 4.2 08/30/2015 1149   CL 105 12/05/2018 0950   CL 104 11/02/2016 1335   CO2 25 12/05/2018 0950   CO2 27 11/02/2016 1335   CO2 27 08/30/2015 1149   BUN 14 12/05/2018 0950   BUN 11 11/02/2016 1335   BUN 16.1 08/30/2015 1149   CREATININE 0.63 12/05/2018 0950   CREATININE 0.9 11/02/2016 1335   CREATININE 0.6 08/30/2015 1149      Component Value Date/Time   CALCIUM 9.0 12/05/2018 0950   CALCIUM 9.2 11/02/2016 1335   CALCIUM 9.3 08/30/2015 1149   ALKPHOS 87 12/05/2018 0950   ALKPHOS 94 (H) 11/02/2016 1335   ALKPHOS 110 08/30/2015 1149   AST 20 12/05/2018 0950   AST 26 08/30/2015 1149   ALT 18 12/05/2018 0950   ALT 25 11/02/2016 1335   ALT 33 08/30/2015 1149   BILITOT 0.5 12/05/2018 0950   BILITOT 0.55 08/30/2015 1149      Impression and Plan: Lauren [name] is a very pleasant 73 yo caucasian female with history of stage I infiltrating lobular carcinoma of the left breast.   She had a lumpectomy and radiation followed by tamoxifen completed in 2004.   We will continue to follow along with her and plan to see her back again in another year.   She is in agreement with the plan and will contact our office with any questions or concerns.  We can certainly see her sooner if need be.   [name], MD 11/23/202011:49 AM

## 2018-12-06 ENCOUNTER — Other Ambulatory Visit: Payer: Self-pay

## 2018-12-06 DIAGNOSIS — Z20822 Contact with and (suspected) exposure to covid-19: Secondary | ICD-10-CM

## 2018-12-06 DIAGNOSIS — Z20828 Contact with and (suspected) exposure to other viral communicable diseases: Secondary | ICD-10-CM | POA: Diagnosis not present

## 2018-12-07 LAB — NOVEL CORONAVIRUS, NAA: SARS-CoV-2, NAA: NOT DETECTED

## 2018-12-16 DIAGNOSIS — E039 Hypothyroidism, unspecified: Secondary | ICD-10-CM | POA: Diagnosis not present

## 2018-12-16 DIAGNOSIS — C50919 Malignant neoplasm of unspecified site of unspecified female breast: Secondary | ICD-10-CM | POA: Diagnosis not present

## 2018-12-16 DIAGNOSIS — L4052 Psoriatic arthritis mutilans: Secondary | ICD-10-CM | POA: Diagnosis not present

## 2018-12-16 DIAGNOSIS — K219 Gastro-esophageal reflux disease without esophagitis: Secondary | ICD-10-CM | POA: Diagnosis not present

## 2018-12-16 DIAGNOSIS — Z79899 Other long term (current) drug therapy: Secondary | ICD-10-CM | POA: Diagnosis not present

## 2018-12-16 DIAGNOSIS — E785 Hyperlipidemia, unspecified: Secondary | ICD-10-CM | POA: Diagnosis not present

## 2018-12-19 ENCOUNTER — Ambulatory Visit (HOSPITAL_COMMUNITY)
Admission: RE | Admit: 2018-12-19 | Discharge: 2018-12-19 | Disposition: A | Discharge status: Home / Self Care | Payer: Medicare Other | Source: Ambulatory Visit | Attending: Hematology & Oncology | Admitting: Hematology & Oncology

## 2018-12-19 ENCOUNTER — Other Ambulatory Visit: Payer: Self-pay

## 2018-12-19 DIAGNOSIS — Z1231 Encounter for screening mammogram for malignant neoplasm of breast: Secondary | ICD-10-CM | POA: Insufficient documentation

## 2018-12-23 ENCOUNTER — Other Ambulatory Visit: Payer: Self-pay | Admitting: Internal Medicine

## 2018-12-23 ENCOUNTER — Other Ambulatory Visit (HOSPITAL_COMMUNITY): Payer: Self-pay | Admitting: Internal Medicine

## 2018-12-23 ENCOUNTER — Other Ambulatory Visit (HOSPITAL_COMMUNITY): Payer: Self-pay | Admitting: Respiratory Therapy

## 2018-12-23 DIAGNOSIS — R0602 Shortness of breath: Secondary | ICD-10-CM | POA: Diagnosis not present

## 2018-12-23 DIAGNOSIS — E039 Hypothyroidism, unspecified: Secondary | ICD-10-CM | POA: Diagnosis not present

## 2018-12-23 DIAGNOSIS — Z780 Asymptomatic menopausal state: Secondary | ICD-10-CM

## 2018-12-23 DIAGNOSIS — E785 Hyperlipidemia, unspecified: Secondary | ICD-10-CM | POA: Diagnosis not present

## 2018-12-23 DIAGNOSIS — Z853 Personal history of malignant neoplasm of breast: Secondary | ICD-10-CM | POA: Diagnosis not present

## 2018-12-23 DIAGNOSIS — L4050 Arthropathic psoriasis, unspecified: Secondary | ICD-10-CM | POA: Diagnosis not present

## 2018-12-28 ENCOUNTER — Ambulatory Visit: Payer: Medicare Other | Attending: Internal Medicine

## 2018-12-28 ENCOUNTER — Other Ambulatory Visit: Payer: Self-pay

## 2018-12-28 DIAGNOSIS — Z20822 Contact with and (suspected) exposure to covid-19: Secondary | ICD-10-CM

## 2018-12-29 LAB — NOVEL CORONAVIRUS, NAA: SARS-CoV-2, NAA: NOT DETECTED

## 2019-01-09 ENCOUNTER — Other Ambulatory Visit (HOSPITAL_COMMUNITY): Payer: Self-pay | Admitting: Internal Medicine

## 2019-01-09 ENCOUNTER — Other Ambulatory Visit: Payer: Self-pay

## 2019-01-09 ENCOUNTER — Ambulatory Visit (HOSPITAL_COMMUNITY)
Admission: RE | Admit: 2019-01-09 | Discharge: 2019-01-09 | Disposition: A | Discharge status: Home / Self Care | Payer: Medicare Other | Source: Ambulatory Visit | Attending: Internal Medicine | Admitting: Internal Medicine

## 2019-01-09 DIAGNOSIS — R0602 Shortness of breath: Secondary | ICD-10-CM

## 2019-01-10 ENCOUNTER — Other Ambulatory Visit: Payer: Medicare Other

## 2019-01-11 ENCOUNTER — Other Ambulatory Visit (HOSPITAL_COMMUNITY): Payer: Self-pay | Admitting: Respiratory Therapy

## 2019-01-11 DIAGNOSIS — R0602 Shortness of breath: Secondary | ICD-10-CM

## 2019-01-17 ENCOUNTER — Ambulatory Visit: Payer: Medicare Other | Attending: Internal Medicine

## 2019-01-17 ENCOUNTER — Other Ambulatory Visit: Payer: Self-pay

## 2019-01-17 ENCOUNTER — Other Ambulatory Visit: Payer: Medicare Other

## 2019-01-17 DIAGNOSIS — Z20822 Contact with and (suspected) exposure to covid-19: Secondary | ICD-10-CM | POA: Diagnosis not present

## 2019-01-19 LAB — NOVEL CORONAVIRUS, NAA: SARS-CoV-2, NAA: NOT DETECTED

## 2019-01-20 ENCOUNTER — Ambulatory Visit (HOSPITAL_COMMUNITY)
Admission: RE | Admit: 2019-01-20 | Discharge: 2019-01-20 | Disposition: A | Discharge status: Home / Self Care | Payer: Medicare Other | Source: Ambulatory Visit | Attending: Internal Medicine | Admitting: Internal Medicine

## 2019-01-20 ENCOUNTER — Other Ambulatory Visit: Payer: Self-pay

## 2019-01-20 DIAGNOSIS — Z780 Asymptomatic menopausal state: Secondary | ICD-10-CM | POA: Insufficient documentation

## 2019-01-20 DIAGNOSIS — M8589 Other specified disorders of bone density and structure, multiple sites: Secondary | ICD-10-CM | POA: Diagnosis not present

## 2019-04-27 ENCOUNTER — Ambulatory Visit: Payer: Medicare Other | Attending: Internal Medicine

## 2019-04-27 DIAGNOSIS — Z23 Encounter for immunization: Secondary | ICD-10-CM

## 2019-04-27 NOTE — Progress Notes (Signed)
   Covid-19 Vaccination Clinic  Name:  Lauren [name]    MRN: [id number] DOB: 14-Feb-1945  04/27/2019  [name] was observed post Covid-19 immunization for 15 minutes without incident. She was provided with Vaccine Information Sheet and instruction to access the V-Safe system.   [name] was instructed to call 911 with any severe reactions post vaccine: Marland Kitchen Difficulty breathing  . Swelling of face and throat  . A fast heartbeat  . A bad rash all over body  . Dizziness and weakness   Immunizations Administered    Name Date Dose VIS Date Route   Moderna COVID-19 Vaccine 04/27/2019 10:14 AM 0.5 mL 12/13/2018 Intramuscular   Manufacturer: Moderna   Lot: [id number]   [id number]

## 2019-05-03 ENCOUNTER — Other Ambulatory Visit (HOSPITAL_COMMUNITY): Payer: Self-pay | Admitting: Respiratory Therapy

## 2019-05-03 DIAGNOSIS — R0602 Shortness of breath: Secondary | ICD-10-CM

## 2019-05-19 ENCOUNTER — Other Ambulatory Visit (HOSPITAL_COMMUNITY)
Admission: RE | Admit: 2019-05-19 | Discharge: 2019-05-19 | Disposition: A | Discharge status: Home / Self Care | Payer: Medicare Other | Source: Ambulatory Visit | Attending: Internal Medicine | Admitting: Internal Medicine

## 2019-05-19 ENCOUNTER — Other Ambulatory Visit: Payer: Self-pay

## 2019-05-19 DIAGNOSIS — Z20822 Contact with and (suspected) exposure to covid-19: Secondary | ICD-10-CM | POA: Insufficient documentation

## 2019-05-19 DIAGNOSIS — Z01812 Encounter for preprocedural laboratory examination: Secondary | ICD-10-CM | POA: Diagnosis not present

## 2019-05-20 LAB — SARS CORONAVIRUS 2 (TAT 6-24 HRS): SARS Coronavirus 2: NEGATIVE

## 2019-05-23 ENCOUNTER — Ambulatory Visit (HOSPITAL_COMMUNITY)
Admission: RE | Admit: 2019-05-23 | Discharge: 2019-05-23 | Disposition: A | Discharge status: Home / Self Care | Payer: Medicare Other | Source: Ambulatory Visit | Attending: Internal Medicine | Admitting: Internal Medicine

## 2019-05-23 ENCOUNTER — Other Ambulatory Visit: Payer: Self-pay

## 2019-05-23 DIAGNOSIS — R0602 Shortness of breath: Secondary | ICD-10-CM | POA: Insufficient documentation

## 2019-05-23 LAB — PULMONARY FUNCTION TEST
DL/VA % pred: 88 %
DL/VA: 3.73 ml/min/mmHg/L
DLCO unc % pred: 88 %
DLCO unc: 15.34 ml/min/mmHg
FEF 25-75 Post: 1.99 L/sec
FEF 25-75 Pre: 1.69 L/sec
FEF2575-%Change-Post: 17 %
FEF2575-%Pred-Post: 129 %
FEF2575-%Pred-Pre: 109 %
FEV1-%Change-Post: 3 %
FEV1-%Pred-Post: 110 %
FEV1-%Pred-Pre: 106 %
FEV1-Post: 2.06 L
FEV1-Pre: 1.98 L
FEV1FVC-%Change-Post: 0 %
FEV1FVC-%Pred-Pre: 102 %
FEV6-%Change-Post: 5 %
FEV6-%Pred-Post: 113 %
FEV6-%Pred-Pre: 106 %
FEV6-Post: 2.68 L
FEV6-Pre: 2.53 L
FEV6FVC-%Change-Post: 1 %
FEV6FVC-%Pred-Post: 105 %
FEV6FVC-%Pred-Pre: 103 %
FVC-%Change-Post: 4 %
FVC-%Pred-Post: 107 %
FVC-%Pred-Pre: 103 %
FVC-Post: 2.69 L
FVC-Pre: 2.57 L
Post FEV1/FVC ratio: 77 %
Post FEV6/FVC ratio: 100 %
Pre FEV1/FVC ratio: 77 %
Pre FEV6/FVC Ratio: 98 %
RV % pred: 110 %
RV: 2.34 L
TLC % pred: 109 %
TLC: 5.06 L

## 2019-05-23 MED ORDER — ALBUTEROL SULFATE (2.5 MG/3ML) 0.083% IN NEBU
2.5000 mg | INHALATION_SOLUTION | Freq: Once | RESPIRATORY_TRACT | Status: AC
  Administered 2019-05-23: 2.5 mg via RESPIRATORY_TRACT

## 2019-06-02 DIAGNOSIS — Z23 Encounter for immunization: Secondary | ICD-10-CM | POA: Diagnosis not present

## 2019-06-06 ENCOUNTER — Ambulatory Visit: Payer: Medicare Other

## 2019-11-20 ENCOUNTER — Other Ambulatory Visit (HOSPITAL_COMMUNITY): Payer: Self-pay | Admitting: Internal Medicine

## 2019-11-20 ENCOUNTER — Other Ambulatory Visit (HOSPITAL_COMMUNITY): Payer: Self-pay | Admitting: Nurse Practitioner

## 2019-11-20 DIAGNOSIS — Z1231 Encounter for screening mammogram for malignant neoplasm of breast: Secondary | ICD-10-CM

## 2019-11-29 DIAGNOSIS — Z23 Encounter for immunization: Secondary | ICD-10-CM | POA: Diagnosis not present

## 2019-12-05 ENCOUNTER — Telehealth: Payer: Self-pay

## 2019-12-05 ENCOUNTER — Inpatient Hospital Stay: Payer: Medicare Other

## 2019-12-05 ENCOUNTER — Inpatient Hospital Stay: Payer: Medicare Other | Attending: Hematology & Oncology | Admitting: Hematology & Oncology

## 2019-12-05 ENCOUNTER — Other Ambulatory Visit: Payer: Self-pay

## 2019-12-05 ENCOUNTER — Encounter: Payer: Self-pay | Admitting: Hematology & Oncology

## 2019-12-05 VITALS — BP 134/89 | HR 78 | Temp 98.4°F | Resp 18 | Wt 159.0 lb

## 2019-12-05 DIAGNOSIS — Z170 Estrogen receptor positive status [ER+]: Secondary | ICD-10-CM | POA: Diagnosis not present

## 2019-12-05 DIAGNOSIS — Z9223 Personal history of estrogen therapy: Secondary | ICD-10-CM | POA: Diagnosis not present

## 2019-12-05 DIAGNOSIS — C50912 Malignant neoplasm of unspecified site of left female breast: Secondary | ICD-10-CM | POA: Diagnosis not present

## 2019-12-05 DIAGNOSIS — Z853 Personal history of malignant neoplasm of breast: Secondary | ICD-10-CM | POA: Diagnosis not present

## 2019-12-05 DIAGNOSIS — Z923 Personal history of irradiation: Secondary | ICD-10-CM | POA: Diagnosis not present

## 2019-12-05 LAB — CBC WITH DIFFERENTIAL (CANCER CENTER ONLY)
Abs Immature Granulocytes: 0.04 10*3/uL (ref 0.00–0.07)
Basophils Absolute: 0 10*3/uL (ref 0.0–0.1)
Basophils Relative: 0 %
Eosinophils Absolute: 0 10*3/uL (ref 0.0–0.5)
Eosinophils Relative: 0 %
HCT: 43.9 % (ref 36.0–46.0)
Hemoglobin: 14.1 g/dL (ref 12.0–15.0)
Immature Granulocytes: 1 %
Lymphocytes Relative: 37 %
Lymphs Abs: 2.7 10*3/uL (ref 0.7–4.0)
MCH: 30.1 pg (ref 26.0–34.0)
MCHC: 32.1 g/dL (ref 30.0–36.0)
MCV: 93.8 fL (ref 80.0–100.0)
Monocytes Absolute: 0.8 10*3/uL (ref 0.1–1.0)
Monocytes Relative: 10 %
Neutro Abs: 3.8 10*3/uL (ref 1.7–7.7)
Neutrophils Relative %: 52 %
Platelet Count: 285 10*3/uL (ref 150–400)
RBC: 4.68 MIL/uL (ref 3.87–5.11)
RDW: 12.5 % (ref 11.5–15.5)
WBC Count: 7.4 10*3/uL (ref 4.0–10.5)
nRBC: 0 % (ref 0.0–0.2)

## 2019-12-05 LAB — CMP (CANCER CENTER ONLY)
ALT: 21 U/L (ref 0–44)
AST: 19 U/L (ref 15–41)
Albumin: 4.3 g/dL (ref 3.5–5.0)
Alkaline Phosphatase: 81 U/L (ref 38–126)
Anion gap: 8 (ref 5–15)
BUN: 13 mg/dL (ref 8–23)
CO2: 30 mmol/L (ref 22–32)
Calcium: 9.5 mg/dL (ref 8.9–10.3)
Chloride: 100 mmol/L (ref 98–111)
Creatinine: 0.67 mg/dL (ref 0.44–1.00)
GFR, Estimated: >60 mL/min (ref >60)
Glucose, Bld: 92 mg/dL (ref 70–99)
Potassium: 4.1 mmol/L (ref 3.5–5.1)
Sodium: 138 mmol/L (ref 135–145)
Total Bilirubin: 0.4 mg/dL (ref 0.3–1.2)
Total Protein: 7.5 g/dL (ref 6.5–8.1)

## 2019-12-05 NOTE — Telephone Encounter (Signed)
appts made and printed for pt per 12/05/19 LOS,,, AOM

## 2019-12-05 NOTE — Progress Notes (Signed)
Hematology and Oncology Follow Up Visit  Lauren [name] [id number] 07-07-1945 74 y.o. 12/05/2019   Principle Diagnosis:  Stage I (T1 N0 M0) lobular carcinoma of the left breast  Current Therapy:   Observation   Interim History:  Lauren [name] is here today for follow-up.  We see her yearly.  She has been very busy.  As always, she goes down to their house in the [place] during the summertime.  Thankfully, there has not been any issues with her canes.  Her grandkids are out in [place].  She wishes that they were closer.  She has been busy around the house.  She likes to be outside working.  She has had no problems with fever.  She has had no issues with the coronavirus.  She has had no cough.  There is no nausea or vomiting.  She has had no change in bowel or bladder habits.    Her last mammogram was back in December 2020.  Everything looked fine.  She has had no rashes.  There is been no bleeding.  Overall, her performance status is ECOG 0.    Medications:  Allergies as of 12/05/2019   No Known Allergies     Medication List       Accurate as of December 05, 2019 10:47 AM. If you have any questions, ask your nurse or doctor.        atorvastatin 20 MG tablet Commonly known as: LIPITOR Take 20 mg by mouth every other day.   glucosamine-chondroitin 500-400 MG tablet Take 1 tablet by mouth in the morning and at bedtime.   Magnesium 400 MG Caps Take by mouth every morning.   omeprazole 20 MG capsule Commonly known as: PRILOSEC Take 20 mg by mouth daily.   predniSONE 5 MG tablet Commonly known as: DELTASONE Take 5 mg by mouth as needed.   Synthroid 112 MCG tablet Generic drug: levothyroxine Take 112 mcg by mouth daily.   VIACTIV PO Take by mouth. Only with vitamin D       Allergies: No Known Allergies  Past Medical History, Surgical history, Social history, and Family History were reviewed and updated.  Review of Systems: Review of Systems    Constitutional: Negative.   HENT: Negative.   Eyes: Negative.   Respiratory: Negative.   Cardiovascular: Negative.   Gastrointestinal: Negative.   Genitourinary: Negative.   Musculoskeletal: Negative.   Skin: Negative.   Neurological: Negative.   Endo/Heme/Allergies: Negative.   Psychiatric/Behavioral: Negative.      Physical Exam:  weight is 159 lb (72.1 kg). Her oral temperature is 98.4 F (36.9 C). Her blood pressure is 134/89 and her pulse is 78. Her respiration is 18 and oxygen saturation is 100%.   Wt Readings from Last 3 Encounters:  12/05/19 159 lb (72.1 kg)  12/05/18 163 lb (73.9 kg)  11/29/17 161 lb (73 kg)    Physical Exam Vitals reviewed.  Constitutional:      Comments: Breast exam shows right breast with no masses, edema or erythema there is no right axillary adenopathy.  Left breast is slightly contracted from radiation and surgery.  She has a well-healed lumpectomy at about the 2 o'clock position.  There are some slight firmness at the lumpectomy site.  She has no nipple discharge.  There is no left axillary adenopathy.  HENT:     Head: Normocephalic and atraumatic.  Eyes:     Pupils: Pupils are equal, round, and reactive to light.  Cardiovascular:  Rate and Rhythm: Normal rate and regular rhythm.     Heart sounds: Normal heart sounds.  Pulmonary:     Effort: Pulmonary effort is normal.     Breath sounds: Normal breath sounds.  Abdominal:     General: Bowel sounds are normal.     Palpations: Abdomen is soft.  Musculoskeletal:        General: No tenderness or deformity. Normal range of motion.     Cervical back: Normal range of motion.  Lymphadenopathy:     Cervical: No cervical adenopathy.  Skin:    General: Skin is warm and dry.     Findings: No erythema or rash.  Neurological:     Mental Status: She is alert and oriented to person, place, and time.  Psychiatric:        Behavior: Behavior normal.        Thought Content: Thought content  normal.        Judgment: Judgment normal.      Lab Results  Component Value Date   WBC 7.4 12/05/2019   HGB 14.1 12/05/2019   HCT 43.9 12/05/2019   MCV 93.8 12/05/2019   PLT 285 12/05/2019   No results found for: FERRITIN, IRON, TIBC, UIBC, IRONPCTSAT Lab Results  Component Value Date   RBC 4.68 12/05/2019   No results found for: KPAFRELGTCHN, LAMBDASER, KAPLAMBRATIO No results found for: IGGSERUM, IGA, IGMSERUM No results found for: Odetta Pink, SPEI   Chemistry      Component Value Date/Time   NA 138 12/05/2019 0958   NA 143 11/02/2016 1335   NA 141 08/30/2015 1149   K 4.1 12/05/2019 0958   K 4.4 11/02/2016 1335   K 4.2 08/30/2015 1149   CL 100 12/05/2019 0958   CL 104 11/02/2016 1335   CO2 30 12/05/2019 0958   CO2 27 11/02/2016 1335   CO2 27 08/30/2015 1149   BUN 13 12/05/2019 0958   BUN 11 11/02/2016 1335   BUN 16.1 08/30/2015 1149   CREATININE 0.67 12/05/2019 0958   CREATININE 0.9 11/02/2016 1335   CREATININE 0.6 08/30/2015 1149      Component Value Date/Time   CALCIUM 9.5 12/05/2019 0958   CALCIUM 9.2 11/02/2016 1335   CALCIUM 9.3 08/30/2015 1149   ALKPHOS 81 12/05/2019 0958   ALKPHOS 94 (H) 11/02/2016 1335   ALKPHOS 110 08/30/2015 1149   AST 19 12/05/2019 0958   AST 26 08/30/2015 1149   ALT 21 12/05/2019 0958   ALT 25 11/02/2016 1335   ALT 33 08/30/2015 1149   BILITOT 0.4 12/05/2019 0958   BILITOT 0.55 08/30/2015 1149      Impression and Plan: Lauren [name] is a very pleasant 74 yo caucasian female with history of stage I infiltrating lobular carcinoma of the left breast.   She had a lumpectomy and radiation followed by tamoxifen completed in 2004.   We will continue to follow along with her and plan to see her back again in another year.   I ensure that she will have a wonderful Thanksgiving and Christmas when she sees her family.  [name], MD 11/23/202110:47 AM

## 2019-12-21 ENCOUNTER — Ambulatory Visit (HOSPITAL_COMMUNITY): Payer: Medicare Other

## 2019-12-26 DIAGNOSIS — L4052 Psoriatic arthritis mutilans: Secondary | ICD-10-CM | POA: Diagnosis not present

## 2019-12-26 DIAGNOSIS — E039 Hypothyroidism, unspecified: Secondary | ICD-10-CM | POA: Diagnosis not present

## 2019-12-26 DIAGNOSIS — Z79899 Other long term (current) drug therapy: Secondary | ICD-10-CM | POA: Diagnosis not present

## 2019-12-26 DIAGNOSIS — C50919 Malignant neoplasm of unspecified site of unspecified female breast: Secondary | ICD-10-CM | POA: Diagnosis not present

## 2019-12-26 DIAGNOSIS — E785 Hyperlipidemia, unspecified: Secondary | ICD-10-CM | POA: Diagnosis not present

## 2019-12-26 DIAGNOSIS — K219 Gastro-esophageal reflux disease without esophagitis: Secondary | ICD-10-CM | POA: Diagnosis not present

## 2020-01-01 DIAGNOSIS — Z6830 Body mass index (BMI) 30.0-30.9, adult: Secondary | ICD-10-CM | POA: Diagnosis not present

## 2020-01-01 DIAGNOSIS — E039 Hypothyroidism, unspecified: Secondary | ICD-10-CM | POA: Diagnosis not present

## 2020-01-01 DIAGNOSIS — Z853 Personal history of malignant neoplasm of breast: Secondary | ICD-10-CM | POA: Diagnosis not present

## 2020-01-01 DIAGNOSIS — R0602 Shortness of breath: Secondary | ICD-10-CM | POA: Diagnosis not present

## 2020-01-01 DIAGNOSIS — L4052 Psoriatic arthritis mutilans: Secondary | ICD-10-CM | POA: Diagnosis not present

## 2020-01-10 ENCOUNTER — Ambulatory Visit (HOSPITAL_COMMUNITY)
Admission: RE | Admit: 2020-01-10 | Discharge: 2020-01-10 | Disposition: A | Discharge status: Home / Self Care | Payer: Medicare Other | Source: Ambulatory Visit | Attending: Internal Medicine | Admitting: Internal Medicine

## 2020-01-10 ENCOUNTER — Other Ambulatory Visit: Payer: Self-pay

## 2020-01-10 DIAGNOSIS — Z1231 Encounter for screening mammogram for malignant neoplasm of breast: Secondary | ICD-10-CM | POA: Insufficient documentation

## 2020-05-06 ENCOUNTER — Encounter (INDEPENDENT_AMBULATORY_CARE_PROVIDER_SITE_OTHER): Payer: Self-pay | Admitting: *Deleted

## 2020-05-21 ENCOUNTER — Encounter (INDEPENDENT_AMBULATORY_CARE_PROVIDER_SITE_OTHER): Payer: Self-pay | Admitting: *Deleted

## 2020-05-21 ENCOUNTER — Telehealth (INDEPENDENT_AMBULATORY_CARE_PROVIDER_SITE_OTHER): Payer: Self-pay | Admitting: *Deleted

## 2020-05-21 ENCOUNTER — Other Ambulatory Visit (INDEPENDENT_AMBULATORY_CARE_PROVIDER_SITE_OTHER): Payer: Self-pay | Admitting: *Deleted

## 2020-05-21 MED ORDER — PEG 3350-KCL-NA BICARB-NACL 420 G PO SOLR: 4000.0000 mL | Freq: Once | ORAL | 0 refills | Status: AC

## 2020-05-21 NOTE — Telephone Encounter (Signed)
Done

## 2020-05-21 NOTE — Telephone Encounter (Signed)
Patient needs trilyte 

## 2020-05-22 DIAGNOSIS — H43811 Vitreous degeneration, right eye: Secondary | ICD-10-CM | POA: Diagnosis not present

## 2020-05-22 DIAGNOSIS — H31092 Other chorioretinal scars, left eye: Secondary | ICD-10-CM | POA: Diagnosis not present

## 2020-05-22 DIAGNOSIS — H40013 Open angle with borderline findings, low risk, bilateral: Secondary | ICD-10-CM | POA: Diagnosis not present

## 2020-05-22 DIAGNOSIS — Z961 Presence of intraocular lens: Secondary | ICD-10-CM | POA: Diagnosis not present

## 2020-05-22 DIAGNOSIS — H35371 Puckering of macula, right eye: Secondary | ICD-10-CM | POA: Diagnosis not present

## 2020-05-22 DIAGNOSIS — Z9889 Other specified postprocedural states: Secondary | ICD-10-CM | POA: Diagnosis not present

## 2020-05-23 ENCOUNTER — Other Ambulatory Visit (INDEPENDENT_AMBULATORY_CARE_PROVIDER_SITE_OTHER): Payer: Self-pay

## 2020-05-23 DIAGNOSIS — Z1211 Encounter for screening for malignant neoplasm of colon: Secondary | ICD-10-CM

## 2020-06-05 ENCOUNTER — Telehealth (INDEPENDENT_AMBULATORY_CARE_PROVIDER_SITE_OTHER): Payer: Self-pay | Admitting: *Deleted

## 2020-06-05 NOTE — Telephone Encounter (Signed)
Referring MD/PCP: fagan  Procedure: tcs  Reason/Indication:  screening  Has patient had this procedure before?  Yes, 10 yrs ago  If so, when, by whom and where?    Is there a family history of colon cancer?  no  Who?  What age when diagnosed?    Is patient diabetic? If yes, Type 1 or Type 2   no      Does patient have prosthetic heart valve or mechanical valve?  no  Do you have a pacemaker/defibrillator?  no  Has patient ever had endocarditis/atrial fibrillation? no  Have you had a stroke/heart attack last 6 mths? no  Does patient use oxygen? no  Has patient had joint replacement within last 12 months?  no  Is patient constipated or do they take laxatives? no  Does patient have a history of alcohol/drug use?  no  Is patient on blood thinner such as Coumadin, Plavix and/or Aspirin? no  Do you take medicine for weight loss?  no  For female patients,: do you still have your menstrual cycle?   Medications: synthroid 112 mcg daily, omeprazole 20 mg daily, glucosamine/chondroitin 1500/1200 mg bid, magnesium 400 mg daily, calcium 600 w vit d daily  Allergies: nkda  Medication Adjustment per Dr Rehman/Dr Jenetta Downer   Procedure date & time: 07/04/20

## 2020-07-02 DIAGNOSIS — H40013 Open angle with borderline findings, low risk, bilateral: Secondary | ICD-10-CM | POA: Diagnosis not present

## 2020-07-02 DIAGNOSIS — H04123 Dry eye syndrome of bilateral lacrimal glands: Secondary | ICD-10-CM | POA: Diagnosis not present

## 2020-07-03 ENCOUNTER — Other Ambulatory Visit (HOSPITAL_COMMUNITY): Payer: Medicare Other

## 2020-07-03 ENCOUNTER — Telehealth (INDEPENDENT_AMBULATORY_CARE_PROVIDER_SITE_OTHER): Payer: Self-pay | Admitting: *Deleted

## 2020-07-03 DIAGNOSIS — U071 COVID-19: Secondary | ICD-10-CM | POA: Diagnosis not present

## 2020-07-03 NOTE — Telephone Encounter (Signed)
Noted ok thank you

## 2020-07-03 NOTE — Telephone Encounter (Signed)
Patient sch'd for TCS 07/04/20, home test was + this morning for Covid, she's going to drug store this afternoon to be tested, please call her to resch'd TCS

## 2020-07-04 ENCOUNTER — Encounter (HOSPITAL_COMMUNITY): Admission: RE | Payer: Self-pay | Source: Home / Self Care

## 2020-07-04 ENCOUNTER — Ambulatory Visit (HOSPITAL_COMMUNITY): Admission: RE | Admit: 2020-07-04 | Payer: Medicare Other | Source: Home / Self Care | Admitting: Internal Medicine

## 2020-07-04 SURGERY — COLONOSCOPY
Anesthesia: Moderate Sedation

## 2020-07-24 DIAGNOSIS — L700 Acne vulgaris: Secondary | ICD-10-CM | POA: Diagnosis not present

## 2020-07-24 DIAGNOSIS — X32XXXD Exposure to sunlight, subsequent encounter: Secondary | ICD-10-CM | POA: Diagnosis not present

## 2020-07-24 DIAGNOSIS — D225 Melanocytic nevi of trunk: Secondary | ICD-10-CM | POA: Diagnosis not present

## 2020-07-24 DIAGNOSIS — L570 Actinic keratosis: Secondary | ICD-10-CM | POA: Diagnosis not present

## 2020-07-24 DIAGNOSIS — Z1283 Encounter for screening for malignant neoplasm of skin: Secondary | ICD-10-CM | POA: Diagnosis not present

## 2020-11-13 ENCOUNTER — Telehealth: Payer: Self-pay | Admitting: Hematology & Oncology

## 2020-12-03 ENCOUNTER — Inpatient Hospital Stay (HOSPITAL_BASED_OUTPATIENT_CLINIC_OR_DEPARTMENT_OTHER): Payer: Medicare Other | Admitting: Family

## 2020-12-03 ENCOUNTER — Other Ambulatory Visit: Payer: Self-pay

## 2020-12-03 ENCOUNTER — Other Ambulatory Visit: Payer: Medicare Other

## 2020-12-03 ENCOUNTER — Other Ambulatory Visit (HOSPITAL_COMMUNITY): Payer: Self-pay | Admitting: Internal Medicine

## 2020-12-03 ENCOUNTER — Inpatient Hospital Stay: Payer: Medicare Other | Attending: Hematology & Oncology

## 2020-12-03 ENCOUNTER — Ambulatory Visit: Payer: Medicare Other | Admitting: Hematology & Oncology

## 2020-12-03 ENCOUNTER — Encounter: Payer: Self-pay | Admitting: Family

## 2020-12-03 VITALS — BP 131/80 | HR 83 | Temp 98.0°F | Resp 17 | Ht 61.0 in | Wt 161.0 lb

## 2020-12-03 DIAGNOSIS — Z170 Estrogen receptor positive status [ER+]: Secondary | ICD-10-CM | POA: Insufficient documentation

## 2020-12-03 DIAGNOSIS — C50912 Malignant neoplasm of unspecified site of left female breast: Secondary | ICD-10-CM

## 2020-12-03 DIAGNOSIS — Z853 Personal history of malignant neoplasm of breast: Secondary | ICD-10-CM | POA: Diagnosis not present

## 2020-12-03 DIAGNOSIS — Z9223 Personal history of estrogen therapy: Secondary | ICD-10-CM | POA: Diagnosis not present

## 2020-12-03 DIAGNOSIS — Z923 Personal history of irradiation: Secondary | ICD-10-CM | POA: Diagnosis not present

## 2020-12-03 DIAGNOSIS — Z1231 Encounter for screening mammogram for malignant neoplasm of breast: Secondary | ICD-10-CM

## 2020-12-03 LAB — CBC WITH DIFFERENTIAL (CANCER CENTER ONLY)
Abs Immature Granulocytes: 0.04 10*3/uL (ref 0.00–0.07)
Basophils Absolute: 0 10*3/uL (ref 0.0–0.1)
Basophils Relative: 1 %
Eosinophils Absolute: 0 10*3/uL (ref 0.0–0.5)
Eosinophils Relative: 0 %
HCT: 44.2 % (ref 36.0–46.0)
Hemoglobin: 14.7 g/dL (ref 12.0–15.0)
Immature Granulocytes: 1 %
Lymphocytes Relative: 34 %
Lymphs Abs: 2.4 10*3/uL (ref 0.7–4.0)
MCH: 30.7 pg (ref 26.0–34.0)
MCHC: 33.3 g/dL (ref 30.0–36.0)
MCV: 92.3 fL (ref 80.0–100.0)
Monocytes Absolute: 0.8 10*3/uL (ref 0.1–1.0)
Monocytes Relative: 11 %
Neutro Abs: 3.8 10*3/uL (ref 1.7–7.7)
Neutrophils Relative %: 53 %
Platelet Count: 284 10*3/uL (ref 150–400)
RBC: 4.79 MIL/uL (ref 3.87–5.11)
RDW: 12.6 % (ref 11.5–15.5)
WBC Count: 7.1 10*3/uL (ref 4.0–10.5)
nRBC: 0 % (ref 0.0–0.2)

## 2020-12-03 LAB — CMP (CANCER CENTER ONLY)
ALT: 16 U/L (ref 0–44)
AST: 20 U/L (ref 15–41)
Albumin: 4.3 g/dL (ref 3.5–5.0)
Alkaline Phosphatase: 80 U/L (ref 38–126)
Anion gap: 7 (ref 5–15)
BUN: 16 mg/dL (ref 8–23)
CO2: 28 mmol/L (ref 22–32)
Calcium: 9.6 mg/dL (ref 8.9–10.3)
Chloride: 100 mmol/L (ref 98–111)
Creatinine: 0.66 mg/dL (ref 0.44–1.00)
GFR, Estimated: >60 mL/min (ref >60)
Glucose, Bld: 86 mg/dL (ref 70–99)
Potassium: 4.6 mmol/L (ref 3.5–5.1)
Sodium: 135 mmol/L (ref 135–145)
Total Bilirubin: 0.5 mg/dL (ref 0.3–1.2)
Total Protein: 7.6 g/dL (ref 6.5–8.1)

## 2020-12-03 LAB — LACTATE DEHYDROGENASE: LDH: 198 U/L — ABNORMAL HIGH (ref 98–192)

## 2020-12-03 NOTE — Progress Notes (Signed)
Hematology and Oncology Follow Up Visit  Lauren [name] [id number] 1945-07-03 75 y.o. 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 Hornbaker is here today for follow-up. She is doing well and has no complaints at this time.  She has been spending lots of time in the [place] enjoying retirement with her family.  No changes, she has been doing regular self breast exams. Her mammogram is due at the end of December and she plans to call and schedule before they leave again for the [place].   No lymphedema or adenopathy noted on exam.  No fever, chills, n/v, cough, rash, dizziness, SOB, chest pain, palpitations, abdominal pain or changes in bowel or bladder habits.  No swelling, numbness or tingling in her extremities at this time.  She has psoriatic arthritis and walks 5 days a week to stay active.  No falls or syncope reported.  She has maintained a good appetite and is staying well hydrated. Her weight is stable at 161 lbs.   ECOG Performance Status: 0 - Asymptomatic  Medications:  Allergies as of 12/03/2020   No Known Allergies      Medication List        Accurate as of December 03, 2020 10:53 AM. If you have any questions, ask your nurse or doctor.          acetaminophen 500 MG tablet Commonly known as: TYLENOL Take 1,000 mg by mouth every 6 (six) hours as needed for moderate pain.   Calcium 600+D 600-20 MG-MCG Tabs Generic drug: Calcium Carb-Cholecalciferol Take 1 tablet by mouth every evening.   Glucosamine Chondroitin Triple Tabs Take 1 tablet by mouth 2 (two) times daily.   latanoprost 0.005 % ophthalmic solution Commonly known as: XALATAN Place 1 drop into both eyes at bedtime.   Magnesium 400 MG Caps Take 400 mg by mouth every evening.   omeprazole 20 MG capsule Commonly known as: PRILOSEC Take 20 mg by mouth every  evening.   Synthroid 112 MCG tablet Generic drug: levothyroxine Take 112 mcg by mouth daily.        Allergies: No Known Allergies  Past Medical History, Surgical history, Social history, and Family History were reviewed and updated.  Review of Systems: All other 10 point review of systems is negative.   Physical Exam:  height is 5\' 1"  (1.549 m) and weight is 161 lb (73 kg). Her oral temperature is 98 F (36.7 C). Her blood pressure is 131/80 and her pulse is 83. Her respiration is 17 and oxygen saturation is 97%.   Wt Readings from Last 3 Encounters:  12/03/20 161 lb (73 kg)  12/05/19 159 lb (72.1 kg)  12/05/18 163 lb (73.9 kg)    Ocular: Sclerae unicteric, pupils equal, round and reactive to light Ear-nose-throat: Oropharynx clear, dentition fair Lymphatic: No cervical, supraclavicular or axillary adenopathy Lungs no rales or rhonchi, good excursion bilaterally Heart regular rate and rhythm, no murmur appreciated Abd soft, nontender, positive bowel sounds MSK no focal spinal tenderness, no joint edema Neuro: non-focal, well-oriented, appropriate affect Breasts: No changes per patient   Lab Results  Component Value Date   WBC 7.1 12/03/2020   HGB 14.7 12/03/2020   HCT 44.2 12/03/2020   MCV 92.3 12/03/2020   PLT 284 12/03/2020   No results found for: FERRITIN, IRON, TIBC,  UIBC, IRONPCTSAT Lab Results  Component Value Date   RBC 4.79 12/03/2020   No results found for: KPAFRELGTCHN, LAMBDASER, KAPLAMBRATIO No results found for: IGGSERUM, IGA, IGMSERUM No results found for: Odetta Pink, SPEI   Chemistry      Component Value Date/Time   NA 135 12/03/2020 0952   NA 143 11/02/2016 1335   NA 141 08/30/2015 1149   K 4.6 12/03/2020 0952   K 4.4 11/02/2016 1335   K 4.2 08/30/2015 1149   CL 100 12/03/2020 0952   CL 104 11/02/2016 1335   CO2 28 12/03/2020 0952   CO2 27 11/02/2016 1335   CO2 27 08/30/2015 1149    BUN 16 12/03/2020 0952   BUN 11 11/02/2016 1335   BUN 16.1 08/30/2015 1149   CREATININE 0.66 12/03/2020 0952   CREATININE 0.9 11/02/2016 1335   CREATININE 0.6 08/30/2015 1149      Component Value Date/Time   CALCIUM 9.6 12/03/2020 0952   CALCIUM 9.2 11/02/2016 1335   CALCIUM 9.3 08/30/2015 1149   ALKPHOS 80 12/03/2020 0952   ALKPHOS 94 (H) 11/02/2016 1335   ALKPHOS 110 08/30/2015 1149   AST 20 12/03/2020 0952   AST 26 08/30/2015 1149   ALT 16 12/03/2020 0952   ALT 25 11/02/2016 1335   ALT 33 08/30/2015 1149   BILITOT 0.5 12/03/2020 0952   BILITOT 0.55 08/30/2015 1149       Impression and Plan: [name] is a very pleasant 75 yo caucasian female with history of stage I infiltrating lobular carcinoma of the left breast. She had lumpectomy and radiation followed by Tamoxifen which she completed in 2004.  She has done well since then and so far there has been no evidence of recurrence.  She will have her mammogram in December.  At this time we can release her from our office to continue her routine follow-ups with her PCP.  She will contact our office with any questions or concerns.   Lottie Dawson, NP 11/22/202210:53 AM

## 2020-12-16 DIAGNOSIS — Z23 Encounter for immunization: Secondary | ICD-10-CM | POA: Diagnosis not present

## 2020-12-24 DIAGNOSIS — E039 Hypothyroidism, unspecified: Secondary | ICD-10-CM | POA: Diagnosis not present

## 2020-12-24 DIAGNOSIS — L4052 Psoriatic arthritis mutilans: Secondary | ICD-10-CM | POA: Diagnosis not present

## 2020-12-24 DIAGNOSIS — Z853 Personal history of malignant neoplasm of breast: Secondary | ICD-10-CM | POA: Diagnosis not present

## 2020-12-24 DIAGNOSIS — Z79899 Other long term (current) drug therapy: Secondary | ICD-10-CM | POA: Diagnosis not present

## 2020-12-24 DIAGNOSIS — E785 Hyperlipidemia, unspecified: Secondary | ICD-10-CM | POA: Diagnosis not present

## 2020-12-24 DIAGNOSIS — K219 Gastro-esophageal reflux disease without esophagitis: Secondary | ICD-10-CM | POA: Diagnosis not present

## 2020-12-31 DIAGNOSIS — R000 Tachycardia, unspecified: Secondary | ICD-10-CM | POA: Diagnosis not present

## 2020-12-31 DIAGNOSIS — Z6830 Body mass index (BMI) 30.0-30.9, adult: Secondary | ICD-10-CM | POA: Diagnosis not present

## 2020-12-31 DIAGNOSIS — L4052 Psoriatic arthritis mutilans: Secondary | ICD-10-CM | POA: Diagnosis not present

## 2020-12-31 DIAGNOSIS — Z853 Personal history of malignant neoplasm of breast: Secondary | ICD-10-CM | POA: Diagnosis not present

## 2020-12-31 DIAGNOSIS — E785 Hyperlipidemia, unspecified: Secondary | ICD-10-CM | POA: Diagnosis not present

## 2021-01-10 ENCOUNTER — Ambulatory Visit (HOSPITAL_COMMUNITY)
Admission: RE | Admit: 2021-01-10 | Discharge: 2021-01-10 | Disposition: A | Discharge status: Home / Self Care | Payer: Medicare Other | Source: Ambulatory Visit | Attending: Internal Medicine | Admitting: Internal Medicine

## 2021-01-10 ENCOUNTER — Other Ambulatory Visit: Payer: Self-pay

## 2021-01-10 DIAGNOSIS — Z1231 Encounter for screening mammogram for malignant neoplasm of breast: Secondary | ICD-10-CM | POA: Insufficient documentation

## 2021-01-10 DIAGNOSIS — Z23 Encounter for immunization: Secondary | ICD-10-CM | POA: Diagnosis not present

## 2021-03-14 DIAGNOSIS — H04123 Dry eye syndrome of bilateral lacrimal glands: Secondary | ICD-10-CM | POA: Diagnosis not present

## 2021-03-14 DIAGNOSIS — H0102B Squamous blepharitis left eye, upper and lower eyelids: Secondary | ICD-10-CM | POA: Diagnosis not present

## 2021-03-14 DIAGNOSIS — H00021 Hordeolum internum right upper eyelid: Secondary | ICD-10-CM | POA: Diagnosis not present

## 2021-03-14 DIAGNOSIS — H0102A Squamous blepharitis right eye, upper and lower eyelids: Secondary | ICD-10-CM | POA: Diagnosis not present

## 2021-03-14 DIAGNOSIS — H40013 Open angle with borderline findings, low risk, bilateral: Secondary | ICD-10-CM | POA: Diagnosis not present

## 2021-03-18 ENCOUNTER — Encounter (INDEPENDENT_AMBULATORY_CARE_PROVIDER_SITE_OTHER): Payer: Self-pay | Admitting: *Deleted

## 2021-05-19 DIAGNOSIS — Z79899 Other long term (current) drug therapy: Secondary | ICD-10-CM | POA: Diagnosis not present

## 2021-05-19 DIAGNOSIS — E785 Hyperlipidemia, unspecified: Secondary | ICD-10-CM | POA: Diagnosis not present

## 2021-05-19 DIAGNOSIS — E039 Hypothyroidism, unspecified: Secondary | ICD-10-CM | POA: Diagnosis not present

## 2021-05-26 DIAGNOSIS — E785 Hyperlipidemia, unspecified: Secondary | ICD-10-CM | POA: Diagnosis not present

## 2021-05-26 DIAGNOSIS — E039 Hypothyroidism, unspecified: Secondary | ICD-10-CM | POA: Diagnosis not present

## 2021-05-26 DIAGNOSIS — R130 Aphagia: Secondary | ICD-10-CM | POA: Diagnosis not present

## 2021-05-27 ENCOUNTER — Other Ambulatory Visit (INDEPENDENT_AMBULATORY_CARE_PROVIDER_SITE_OTHER): Payer: Self-pay

## 2021-05-27 ENCOUNTER — Telehealth (INDEPENDENT_AMBULATORY_CARE_PROVIDER_SITE_OTHER): Payer: Self-pay | Admitting: *Deleted

## 2021-05-27 ENCOUNTER — Encounter (INDEPENDENT_AMBULATORY_CARE_PROVIDER_SITE_OTHER): Payer: Self-pay | Admitting: *Deleted

## 2021-05-27 DIAGNOSIS — Z1211 Encounter for screening for malignant neoplasm of colon: Secondary | ICD-10-CM

## 2021-05-27 DIAGNOSIS — R1310 Dysphagia, unspecified: Secondary | ICD-10-CM

## 2021-05-27 NOTE — Telephone Encounter (Signed)
Referring MD/PCP: fagan ? ?Procedure: tcs/egd ? ?Reason/Indication:  screening, dysphagia ? ?Has patient had this procedure before?  Yes, 11 yrs ago ([facility]) ? If so, when, by whom and where?   ? ?Is there a family history of colon cancer?  no ? Who?  What age when diagnosed?   ? ?Is patient diabetic? If yes, Type 1 or Type 2   no ?     ?Does patient have prosthetic heart valve or mechanical valve?  no ? ?Do you have a pacemaker/defibrillator?  no ? ?Has patient ever had endocarditis/atrial fibrillation? no ? ?Does patient use oxygen? no ? ?Has patient had joint replacement within last 12 months?  no ? ?Is patient constipated or do they take laxatives? no ? ?Does patient have a history of alcohol/drug use?  no ? ?Have you had a stroke/heart attack last 6 mths? no ? ?Do you take medicine for weight loss?  no ? ?For female patients,: have you had a hysterectomy yes ?                     are you post menopausal  ?                     do you still have your menstrual cycle no ? ?Is patient on blood thinner such as Coumadin, Plavix and/or Aspirin? no ? ?Medications: levothyroxine 112 mcg daily, ezetimibe 10 mg daily, Vit B12 2500 mcg daily, glucosamine/chondroitin 500/1200 mg bid, magnesium 500 mg daily, calcium 600 mg daily ? ?Allergies: nkda ? ?Medication Adjustment per Dr Rehman/Dr Jenetta Downer  ? ?Procedure date & time: 06/25/21 ? ? ?

## 2021-06-19 NOTE — Patient Instructions (Signed)
Lauren [name]  06/19/2021     '@PREFPERIOPPHARMACY'$ @   Your procedure is scheduled on  06/25/2021.   Report to [facility] at  0830  A.M.   Call this number if you have problems the morning of surgery:  [phone]   Remember:  Follow the diet and prep instructions given to you by the office.    Take these medicines the morning of surgery with A SIP OF WATER                                      synthroid.     Do not wear jewelry, make-up or nail polish.  Do not wear lotions, powders, or perfumes, or deodorant.  Do not shave 48 hours prior to surgery.  Men may shave face and neck.  Do not bring valuables to the hospital.  [facility] is not responsible for any belongings or valuables.  Contacts, dentures or bridgework may not be worn into surgery.  Leave your suitcase in the car.  After surgery it may be brought to your room.  For patients admitted to the hospital, discharge time will be determined by your treatment team.  Patients discharged the day of surgery will not be allowed to drive home and must have someone with them for 24 hours.    Special instructions:   DO NOT smoke tobacco or vape for 24 hours before your procedure.  Please read over the following fact sheets that you were given. Anesthesia Post-op Instructions and Care and Recovery After Surgery      Upper Endoscopy, Adult, Care After This sheet gives you information about how to care for yourself after your procedure. Your health care provider may also give you more specific instructions. If you have problems or questions, contact your health care provider. What can I expect after the procedure? After the procedure, it is common to have: A sore throat. Mild stomach pain or discomfort. Bloating. Nausea. Follow these instructions at home:  Follow instructions from your health care provider about what to eat or drink after your procedure. Return to your normal activities as told by your  health care provider. Ask your health care provider what activities are safe for you. Take over-the-counter and prescription medicines only as told by your health care provider. If you were given a sedative during the procedure, it can affect you for several hours. Do not drive or operate machinery until your health care provider says that it is safe. Keep all follow-up visits as told by your health care provider. This is important. Contact a health care provider if you have: A sore throat that lasts longer than one day. Trouble swallowing. Get help right away if: You vomit blood or your vomit looks like coffee grounds. You have: A fever. Bloody, black, or tarry stools. A severe sore throat or you cannot swallow. Difficulty breathing. Severe pain in your chest or abdomen. Summary After the procedure, it is common to have a sore throat, mild stomach discomfort, bloating, and nausea. If you were given a sedative during the procedure, it can affect you for several hours. Do not drive or operate machinery until your health care provider says that it is safe. Follow instructions from your health care provider about what to eat or drink after your procedure. Return to your normal activities as told by your health care provider.  This information is not intended to replace advice given to you by your health care provider. Make sure you discuss any questions you have with your health care provider. Document Revised: 11/04/2018 Document Reviewed: 05/31/2017 Elsevier Patient Education  [place]. Colonoscopy, Adult, Care After The following information offers guidance on how to care for yourself after your procedure. Your health care provider may also give you more specific instructions. If you have problems or questions, contact your health care provider. What can I expect after the procedure? After the procedure, it is common to have: A small amount of blood in your stool for 24 hours  after the procedure. Some gas. Mild cramping or bloating of your abdomen. Follow these instructions at home: Eating and drinking  Drink enough fluid to keep your urine pale yellow. Follow instructions from your health care provider about eating or drinking restrictions. Resume your normal diet as told by your health care provider. Avoid heavy or fried foods that are hard to digest. Activity Rest as told by your health care provider. Avoid sitting for a long time without moving. Get up to take short walks every 1-2 hours. This is important to improve blood flow and breathing. Ask for help if you feel weak or unsteady. Return to your normal activities as told by your health care provider. Ask your health care provider what activities are safe for you. Managing cramping and bloating  Try walking around when you have cramps or feel bloated. If directed, apply heat to your abdomen as told by your health care provider. Use the heat source that your health care provider recommends, such as a moist heat pack or a heating pad. Place a towel between your skin and the heat source. Leave the heat on for 20-30 minutes. Remove the heat if your skin turns bright red. This is especially important if you are unable to feel pain, heat, or cold. You have a greater risk of getting burned. General instructions If you were given a sedative during the procedure, it can affect you for several hours. Do not drive or operate machinery until your health care provider says that it is safe. For the first 24 hours after the procedure: Do not sign important documents. Do not drink alcohol. Do your regular daily activities at a slower pace than normal. Eat soft foods that are easy to digest. Take over-the-counter and prescription medicines only as told by your health care provider. Keep all follow-up visits. This is important. Contact a health care provider if: You have blood in your stool 2-3 days after the  procedure. Get help right away if: You have more than a small spotting of blood in your stool. You have large blood clots in your stool. You have swelling of your abdomen. You have nausea or vomiting. You have a fever. You have increasing pain in your abdomen that is not relieved with medicine. These symptoms may be an emergency. Get help right away. Call 911. Do not wait to see if the symptoms will go away. Do not drive yourself to the hospital. Summary After the procedure, it is common to have a small amount of blood in your stool. You may also have mild cramping and bloating of your abdomen. If you were given a sedative during the procedure, it can affect you for several hours. Do not drive or operate machinery until your health care provider says that it is safe. Get help right away if you have a lot of blood in your  stool, nausea or vomiting, a fever, or increased pain in your abdomen. This information is not intended to replace advice given to you by your health care provider. Make sure you discuss any questions you have with your health care provider. Document Revised: 08/21/2020 Document Reviewed: 08/21/2020 Elsevier Patient Education  [place] After This sheet gives you information about how to care for yourself after your procedure. Your health care provider may also give you more specific instructions. If you have problems or questions, contact your health care provider. What can I expect after the procedure? After the procedure, it is common to have: Tiredness. Forgetfulness about what happened after the procedure. Impaired judgment for important decisions. Nausea or vomiting. Some difficulty with balance. Follow these instructions at home: For the time period you were told by your health care provider:     Rest as needed. Do not participate in activities where you could fall or become injured. Do not drive or use machinery. Do  not drink alcohol. Do not take sleeping pills or medicines that cause drowsiness. Do not make important decisions or sign legal documents. Do not take care of children on your own. Eating and drinking Follow the diet that is recommended by your health care provider. Drink enough fluid to keep your urine pale yellow. If you vomit: Drink water, juice, or soup when you can drink without vomiting. Make sure you have little or no nausea before eating solid foods. General instructions Have a responsible adult stay with you for the time you are told. It is important to have someone help care for you until you are awake and alert. Take over-the-counter and prescription medicines only as told by your health care provider. If you have sleep apnea, surgery and certain medicines can increase your risk for breathing problems. Follow instructions from your health care provider about wearing your sleep device: Anytime you are sleeping, including during daytime naps. While taking prescription pain medicines, sleeping medicines, or medicines that make you drowsy. Avoid smoking. Keep all follow-up visits as told by your health care provider. This is important. Contact a health care provider if: You keep feeling nauseous or you keep vomiting. You feel light-headed. You are still sleepy or having trouble with balance after 24 hours. You develop a rash. You have a fever. You have redness or swelling around the IV site. Get help right away if: You have trouble breathing. You have new-onset confusion at home. Summary For several hours after your procedure, you may feel tired. You may also be forgetful and have poor judgment. Have a responsible adult stay with you for the time you are told. It is important to have someone help care for you until you are awake and alert. Rest as told. Do not drive or operate machinery. Do not drink alcohol or take sleeping pills. Get help right away if you have trouble  breathing, or if you suddenly become confused. This information is not intended to replace advice given to you by your health care provider. Make sure you discuss any questions you have with your health care provider. Document Revised: 12/03/2020 Document Reviewed: 12/01/2018 Elsevier Patient Education  [place].

## 2021-06-23 ENCOUNTER — Encounter (HOSPITAL_COMMUNITY): Payer: Self-pay

## 2021-06-23 ENCOUNTER — Encounter (HOSPITAL_COMMUNITY)
Admission: RE | Admit: 2021-06-23 | Discharge: 2021-06-23 | Disposition: A | Discharge status: Home / Self Care | Payer: Medicare Other | Source: Ambulatory Visit | Attending: Internal Medicine | Admitting: Internal Medicine

## 2021-06-23 VITALS — Ht 61.0 in | Wt 160.0 lb

## 2021-06-23 DIAGNOSIS — I10 Essential (primary) hypertension: Secondary | ICD-10-CM

## 2021-06-23 DIAGNOSIS — R000 Tachycardia, unspecified: Secondary | ICD-10-CM | POA: Insufficient documentation

## 2021-06-23 DIAGNOSIS — Z01810 Encounter for preprocedural cardiovascular examination: Secondary | ICD-10-CM | POA: Diagnosis not present

## 2021-06-23 DIAGNOSIS — R1310 Dysphagia, unspecified: Secondary | ICD-10-CM

## 2021-06-23 DIAGNOSIS — Z1211 Encounter for screening for malignant neoplasm of colon: Secondary | ICD-10-CM

## 2021-06-23 HISTORY — DX: Unspecified osteoarthritis, unspecified site: M19.90

## 2021-06-25 ENCOUNTER — Other Ambulatory Visit: Payer: Self-pay

## 2021-06-25 ENCOUNTER — Encounter (HOSPITAL_COMMUNITY)
Admission: RE | Disposition: A | Discharge status: Home / Self Care | Payer: Self-pay | Source: Ambulatory Visit | Attending: Internal Medicine

## 2021-06-25 ENCOUNTER — Ambulatory Visit (HOSPITAL_COMMUNITY): Payer: Medicare Other | Admitting: Anesthesiology

## 2021-06-25 ENCOUNTER — Ambulatory Visit (HOSPITAL_COMMUNITY)
Admission: RE | Admit: 2021-06-25 | Discharge: 2021-06-25 | Disposition: A | Discharge status: Home / Self Care | Payer: Medicare Other | Source: Ambulatory Visit | Attending: Internal Medicine | Admitting: Internal Medicine

## 2021-06-25 ENCOUNTER — Ambulatory Visit (HOSPITAL_BASED_OUTPATIENT_CLINIC_OR_DEPARTMENT_OTHER): Payer: Medicare Other | Admitting: Anesthesiology

## 2021-06-25 ENCOUNTER — Encounter (HOSPITAL_COMMUNITY): Payer: Self-pay | Admitting: Internal Medicine

## 2021-06-25 DIAGNOSIS — Z800 Family history of malignant neoplasm of digestive organs: Secondary | ICD-10-CM | POA: Diagnosis not present

## 2021-06-25 DIAGNOSIS — K449 Diaphragmatic hernia without obstruction or gangrene: Secondary | ICD-10-CM | POA: Insufficient documentation

## 2021-06-25 DIAGNOSIS — K2100 Gastro-esophageal reflux disease with esophagitis, without bleeding: Secondary | ICD-10-CM | POA: Insufficient documentation

## 2021-06-25 DIAGNOSIS — Z139 Encounter for screening, unspecified: Secondary | ICD-10-CM | POA: Diagnosis not present

## 2021-06-25 DIAGNOSIS — K2281 Esophageal polyp: Secondary | ICD-10-CM | POA: Diagnosis not present

## 2021-06-25 DIAGNOSIS — K644 Residual hemorrhoidal skin tags: Secondary | ICD-10-CM | POA: Insufficient documentation

## 2021-06-25 DIAGNOSIS — Z1211 Encounter for screening for malignant neoplasm of colon: Secondary | ICD-10-CM | POA: Insufficient documentation

## 2021-06-25 DIAGNOSIS — K317 Polyp of stomach and duodenum: Secondary | ICD-10-CM | POA: Insufficient documentation

## 2021-06-25 DIAGNOSIS — K5730 Diverticulosis of large intestine without perforation or abscess without bleeding: Secondary | ICD-10-CM | POA: Insufficient documentation

## 2021-06-25 DIAGNOSIS — K2289 Other specified disease of esophagus: Secondary | ICD-10-CM | POA: Insufficient documentation

## 2021-06-25 DIAGNOSIS — K31819 Angiodysplasia of stomach and duodenum without bleeding: Secondary | ICD-10-CM | POA: Insufficient documentation

## 2021-06-25 DIAGNOSIS — Z87891 Personal history of nicotine dependence: Secondary | ICD-10-CM | POA: Diagnosis not present

## 2021-06-25 DIAGNOSIS — R1314 Dysphagia, pharyngoesophageal phase: Secondary | ICD-10-CM | POA: Diagnosis not present

## 2021-06-25 DIAGNOSIS — Z8616 Personal history of COVID-19: Secondary | ICD-10-CM | POA: Insufficient documentation

## 2021-06-25 HISTORY — PX: ESOPHAGOGASTRODUODENOSCOPY (EGD) WITH PROPOFOL: SHX5813

## 2021-06-25 HISTORY — PX: BIOPSY: SHX5522

## 2021-06-25 HISTORY — PX: MALONEY DILATION: SHX5535

## 2021-06-25 HISTORY — PX: COLONOSCOPY WITH PROPOFOL: SHX5780

## 2021-06-25 LAB — HM COLONOSCOPY

## 2021-06-25 SURGERY — COLONOSCOPY WITH PROPOFOL
Anesthesia: General

## 2021-06-25 MED ORDER — PROPOFOL 500 MG/50ML IV EMUL
INTRAVENOUS | Status: AC
  Filled 2021-06-25: qty 100

## 2021-06-25 MED ORDER — PANTOPRAZOLE SODIUM 40 MG PO TBEC: 40.0000 mg | DELAYED_RELEASE_TABLET | Freq: Every day | ORAL | 1 refills | Status: DC

## 2021-06-25 MED ORDER — PROPOFOL 500 MG/50ML IV EMUL
INTRAVENOUS | Status: DC | PRN
  Administered 2021-06-25: 150 ug/kg/min via INTRAVENOUS

## 2021-06-25 MED ORDER — LACTATED RINGERS IV SOLN: INTRAVENOUS | Status: DC | PRN

## 2021-06-25 MED ORDER — PROPOFOL 10 MG/ML IV BOLUS
INTRAVENOUS | Status: DC | PRN
  Administered 2021-06-25: 75 mg via INTRAVENOUS
  Administered 2021-06-25: 25 mg via INTRAVENOUS

## 2021-06-25 NOTE — Op Note (Signed)
[facility] Patient Name: Lauren [name] Procedure Date: 06/25/2021 10:16 AM MRN: [id number] Date of Birth: 1945/09/11 Attending MD: Hildred Laser , MD CSN: [id number] Age: 76 Admit Type: Outpatient Procedure:                Colonoscopy Indications:              Screening for colorectal malignant neoplasm Providers:                Hildred Laser, MD, Crystal Page, Everardo Pacific,                            Randa Spike, Technician Referring MD:             Asencion Noble, MD Medicines:                Propofol per Anesthesia Complications:            No immediate complications. Estimated Blood Loss:     Estimated blood loss: none. Procedure:                Pre-Anesthesia Assessment:                           - Prior to the procedure, a History and Physical                            was performed, and patient medications and                            allergies were reviewed. The patient's tolerance of                            previous anesthesia was also reviewed. The risks                            and benefits of the procedure and the sedation                            options and risks were discussed with the patient.                            All questions were answered, and informed consent                            was obtained. Prior Anticoagulants: The patient has                            taken no previous anticoagulant or antiplatelet                            agents. ASA Grade Assessment: II - A patient with                            mild systemic disease. After reviewing the risks  and benefits, the patient was deemed in                            satisfactory condition to undergo the procedure.                           - Prior to the procedure, a History and Physical                            was performed, and patient medications and                            allergies were reviewed. The patient's tolerance of                             previous anesthesia was also reviewed. The risks                            and benefits of the procedure and the sedation                            options and risks were discussed with the patient.                            All questions were answered, and informed consent                            was obtained. ASA Grade Assessment: II - A patient                            with mild systemic disease. After reviewing the                            risks and benefits, the patient was deemed in                            satisfactory condition to undergo the procedure.                           After obtaining informed consent, the colonoscope                            was passed under direct vision. Throughout the                            procedure, the patient's blood pressure, pulse, and                            oxygen saturations were monitored continuously. The                            [id number]-[id number] ([id number]) scope was introduced through  the anus and advanced to the the cecum, identified                            by appendiceal orifice and ileocecal valve. The                            colonoscopy was performed without difficulty. The                            patient tolerated the procedure well. The quality                            of the bowel preparation was good. The ileocecal                            valve, appendiceal orifice, and rectum were                            photographed. Scope In: 10:19:53 AM Scope Out: 10:33:07 AM Scope Withdrawal Time: 0 hours 7 minutes 12 seconds  Total Procedure Duration: 0 hours 13 minutes 14 seconds  Findings:      The perianal and digital rectal examinations were normal.      Multiple diverticula were found in the sigmoid colon.      The exam was otherwise normal throughout the examined colon.      External hemorrhoids were found during retroflexion. The hemorrhoids       were  small. Impression:               - Diverticulosis in the sigmoid colon.                           - External hemorrhoids.                           - No specimens collected. Moderate Sedation:      Per Anesthesia Care Recommendation:           - Patient has a contact number available for                            emergencies. The signs and symptoms of potential                            delayed complications were discussed with the                            patient. Return to normal activities tomorrow.                            Written discharge instructions were provided to the                            patient.                           - High fiber diet today.                           -  Continue present medications.                           - No repeat colonoscopy.                           - No repeat colonoscopy due to age and the absence                            of advanced adenomas. Procedure Code(s):        --- Professional ---                           [phone], Colonoscopy, flexible; diagnostic, including                            collection of specimen(s) by brushing or washing,                            when performed (separate procedure) Diagnosis Code(s):        --- Professional ---                           Z12.11, Encounter for screening for malignant                            neoplasm of colon                           K64.4, Residual hemorrhoidal skin tags                           K57.30, Diverticulosis of large intestine without                            perforation or abscess without bleeding CPT copyright 2019 American Medical Association. All rights reserved. The codes documented in this report are preliminary and upon coder review may  be revised to meet current compliance requirements. Hildred Laser, MD Hildred Laser, MD 06/25/2021 10:51:34 AM This report has been signed electronically. Number of Addenda: 0

## 2021-06-25 NOTE — H&P (Signed)
Lauren [name] is an 76 y.o. female.   Chief Complaint: Patient is here for esophagogastroduodenoscopy with esophageal dilation and colonoscopy. HPI: Patient is 76 year old Caucasian female who has chronic GERD.  She controls her symptoms by watching her diet and uses omeprazole on as-needed basis.  She complains of dysphagia.  She points to her throat and upper sternal area as site of bolus obstruction.  She has never had an episode of food impaction but sometimes she coughs when this occurs.  No history of nausea vomiting abdominal pain melena or rectal bleeding.  She is also undergoing screening colonoscopy.  Last exam was over 10 years ago.  She was scheduled for colonoscopy last year but was canceled because she tested positive for COVID. Patient does not take aspirin or anticoagulants. Family history significant for colon carcinoma in paternal uncle who was in his 64s at the time of diagnosis.  Past Medical History:  Diagnosis Date   Arthritis     Past Surgical History:  Procedure Laterality Date   ABDOMINAL HYSTERECTOMY     BREAST LUMPECTOMY Left 1999    History reviewed. No pertinent family history. Social History:  reports that she has quit smoking. She has never used smokeless tobacco. She reports current alcohol use of about 1.0 standard drink of alcohol per week. She reports that she does not currently use drugs.  Allergies: No Known Allergies  Medications Prior to Admission  Medication Sig Dispense Refill   acetaminophen (TYLENOL) 500 MG tablet Take 1,000 mg by mouth every 6 (six) hours as needed for moderate pain.     Calcium Carb-Cholecalciferol (CALCIUM 600+D) 600-800 MG-UNIT TABS Take 1 tablet by mouth every evening.     calcium carbonate (TUMS) 500 MG chewable tablet Chew 1 tablet by mouth 3 (three) times daily as needed for indigestion or heartburn.     Cyanocobalamin (VITAMIN B-12) 2500 MCG SUBL Take 2,500 mcg by mouth every evening.     ezetimibe (ZETIA) 10 MG  tablet Take 10 mg by mouth every evening.     Glucosamine-Chondroitin (COSAMIN DS PO) Take 1 tablet by mouth in the morning and at bedtime. 1500-1200     latanoprost (XALATAN) 0.005 % ophthalmic solution Place 1 drop into both eyes at bedtime.     Magnesium 500 MG TABS Take 500 mg by mouth daily in the afternoon.     SYNTHROID 112 MCG tablet Take 112 mcg by mouth daily before breakfast.      No results found for this or any previous visit (from the past 48 hour(s)). No results found.  Review of Systems  Blood pressure 119/85, pulse 79, temperature 97.9 F (36.6 C), resp. rate (!) 22, SpO2 95 %. Physical Exam HENT:     Mouth/Throat:     Mouth: Mucous membranes are moist.     Pharynx: Oropharynx is clear.  Eyes:     General: No scleral icterus.    Conjunctiva/sclera: Conjunctivae normal.  Cardiovascular:     Rate and Rhythm: Normal rate and regular rhythm.     Heart sounds: Normal heart sounds. No murmur heard. Pulmonary:     Effort: Pulmonary effort is normal.     Breath sounds: Normal breath sounds.  Abdominal:     General: There is no distension.     Palpations: Abdomen is soft. There is no mass.     Tenderness: There is no abdominal tenderness.  Musculoskeletal:        General: No swelling.     Cervical back:  Neck supple.  Lymphadenopathy:     Cervical: No cervical adenopathy.  Skin:    General: Skin is warm and dry.  Neurological:     Mental Status: She is alert.      Assessment/Plan  Dysphagia in patient with chronic GERD. Esophagogastroduodenoscopy with esophageal dilation and average rescreening colonoscopy.  Hildred Laser, MD 06/25/2021, 9:51 AM

## 2021-06-25 NOTE — Anesthesia Postprocedure Evaluation (Signed)
Anesthesia Post Note  Patient: Lauren [name]  Procedure(s) Performed: COLONOSCOPY WITH PROPOFOL ESOPHAGOGASTRODUODENOSCOPY (EGD) WITH PROPOFOL BIOPSY [place]  Patient location during evaluation: Short Stay Anesthesia Type: General Level of consciousness: awake and alert Vital Signs Assessment: post-procedure vital signs reviewed and stable Respiratory status: spontaneous breathing Cardiovascular status: blood pressure returned to baseline Postop Assessment: no apparent nausea or vomiting Anesthetic complications: no   No notable events documented.   Last Vitals:  Vitals:   06/25/21 0856  BP: 119/85  Pulse: 79  Resp: (!) 22  Temp: 36.6 C  SpO2: 95%    Last Pain:  Vitals:   06/25/21 0956  PainSc: 0-No pain                 Deshanae Lindo

## 2021-06-25 NOTE — Op Note (Signed)
[facility] Patient Name: Lauren [name] Procedure Date: 06/25/2021 9:32 AM MRN: [id number] Date of Birth: Jan 22, 1945 Attending MD: Hildred Laser , MD CSN: [id number] Age: 76 Admit Type: Outpatient Procedure:                Upper GI endoscopy Indications:              Esophageal dysphagia Providers:                Hildred Laser, MD, Crystal Page, Everardo Pacific,                            Randa Spike, Technician Referring MD:             Asencion Noble, MD Medicines:                Propofol per Anesthesia Complications:            No immediate complications. Estimated Blood Loss:     Estimated blood loss was minimal. Estimated blood                            loss was minimal. Procedure:                Pre-Anesthesia Assessment:                           - Prior to the procedure, a History and Physical                            was performed, and patient medications and                            allergies were reviewed. The patient's tolerance of                            previous anesthesia was also reviewed. The risks                            and benefits of the procedure and the sedation                            options and risks were discussed with the patient.                            All questions were answered, and informed consent                            was obtained. Prior Anticoagulants: The patient has                            taken no previous anticoagulant or antiplatelet                            agents. ASA Grade Assessment: II - A patient with  mild systemic disease. After reviewing the risks                            and benefits, the patient was deemed in                            satisfactory condition to undergo the procedure.                           After obtaining informed consent, the endoscope was                            passed under direct vision. Throughout the                            procedure, the  patient's blood pressure, pulse, and                            oxygen saturations were monitored continuously. The                            [id number]-[id number] ([id number]) scope was introduced through the                            mouth, and advanced to the second part of duodenum.                            The upper GI endoscopy was accomplished without                            difficulty. The patient tolerated the procedure                            well. Scope In: 10:05:32 AM Scope Out: 10:14:55 AM Total Procedure Duration: 0 hours 9 minutes 23 seconds  Findings:      The hypopharynx was normal.      The proximal esophagus, mid esophagus and distal esophagus were normal.      LA Grade A (one or more mucosal breaks less than 5 mm, not extending       between tops of 2 mucosal folds) esophagitis was found 32 cm from the       incisors.      The Z-line was irregular and was found 33 cm from the incisors.      A 2 cm hiatal hernia was present.      No endoscopic abnormality was evident in the esophagus to explain the       patient's complaint of dysphagia. It was decided, however, to proceed       with dilation of the entire esophagus. The scope was withdrawn. Dilation       was performed with a Maloney dilator with mild resistance at 36 Fr. The       dilation site was examined following endoscope reinsertion and showed no       change and no bleeding, mucosal tear or perforation.      Multiple 3 to 6 mm pedunculated and  sessile polyps with no stigmata of       recent bleeding were found in the gastric fundus and in the gastric       body. Biopsies were taken with a cold forceps for histology. The       pathology specimen was placed into Bottle Number 1.      The exam of the stomach was otherwise normal.      The duodenal bulb and second portion of the duodenum were normal.      Lymphangiectasia was present in the second portion of the duodenum. Impression:               - Normal  hypopharynx.                           - Normal proximal esophagus, mid esophagus and                            distal esophagus.                           - LA Grade A reflux esophagitis and distal                            esophagus/GE junction.                           - Z-line irregular, 33 cm from the incisors.                           - 2 cm hiatal hernia.                           - No endoscopic esophageal abnormality to explain                            patient's dysphagia. Esophagus dilated. Dilated.                           - Multiple gastric polyps. Biopsied.                           - Post bulbar duodenal lymphangiectasia. Moderate Sedation:      Per Anesthesia Care Recommendation:           - Patient has a contact number available for                            emergencies. The signs and symptoms of potential                            delayed complications were discussed with the                            patient. Return to normal activities tomorrow.                            Written discharge instructions were provided to the  patient.                           - Resume previous diet today.                           - Continue present medications.                           - No aspirin, ibuprofen, naproxen, or other                            non-steroidal anti-inflammatory drugs for 1 day.                           -Pantoprazole 40 mg by mouth 30 minutes before                            breakfast daily.                           - Await pathology results. Procedure Code(s):        --- Professional ---                           [phone], Esophagogastroduodenoscopy, flexible,                            transoral; with biopsy, single or multiple                           43450, Dilation of esophagus, by unguided sound or                            bougie, single or multiple passes Diagnosis Code(s):        --- Professional ---                            K21.00, Gastro-esophageal reflux disease with                            esophagitis, without bleeding                           K22.8, Other specified diseases of esophagus                           K44.9, Diaphragmatic hernia without obstruction or                            gangrene                           K31.7, Polyp of stomach and duodenum                           R13.14, Dysphagia, pharyngoesophageal phase CPT copyright 2019 American Medical Association. All rights reserved. The codes  documented in this report are preliminary and upon coder review may  be revised to meet current compliance requirements. Hildred Laser, MD Hildred Laser, MD 06/25/2021 10:48:00 AM This report has been signed electronically. Number of Addenda: 0

## 2021-06-25 NOTE — Transfer of Care (Signed)
Immediate Anesthesia Transfer of Care Note  Patient: Lauren [name]  Procedure(s) Performed: COLONOSCOPY WITH PROPOFOL ESOPHAGOGASTRODUODENOSCOPY (EGD) WITH PROPOFOL BIOPSY MALONEY DILATION  Patient Location: Short Stay  Anesthesia Type:General  Level of Consciousness: awake  Airway & Oxygen Therapy: Patient Spontanous Breathing  Post-op Assessment: Report given to RN  Post vital signs: Reviewed and stable  Last Vitals:  Vitals Value Taken Time  BP    Temp    Pulse    Resp    SpO2      Last Pain:  Vitals:   06/25/21 0956  PainSc: 0-No pain         Complications: No notable events documented.

## 2021-06-25 NOTE — Anesthesia Preprocedure Evaluation (Signed)
Anesthesia Evaluation  Patient identified by MRN, date of birth, ID band Patient awake    Reviewed: Allergy & Precautions, H&P , NPO status , Patient's Chart, lab work & pertinent test results, reviewed documented beta blocker date and time   Airway Mallampati: II  TM Distance: >3 FB Neck ROM: full    Dental no notable dental hx.    Pulmonary neg pulmonary ROS, former smoker,    Pulmonary exam normal breath sounds clear to auscultation       Cardiovascular Exercise Tolerance: Good negative cardio ROS   Rhythm:regular Rate:Normal     Neuro/Psych negative neurological ROS  negative psych ROS   GI/Hepatic negative GI ROS, Neg liver ROS,   Endo/Other  negative endocrine ROS  Renal/GU negative Renal ROS  negative genitourinary   Musculoskeletal   Abdominal   Peds  Hematology negative hematology ROS (+)   Anesthesia Other Findings   Reproductive/Obstetrics negative OB ROS                             Anesthesia Physical Anesthesia Plan  ASA: 2  Anesthesia Plan: General   Post-op Pain Management:    Induction:   PONV Risk Score and Plan: Propofol infusion  Airway Management Planned:   Additional Equipment:   Intra-op Plan:   Post-operative Plan:   Informed Consent: I have reviewed the patients History and Physical, chart, labs and discussed the procedure including the risks, benefits and alternatives for the proposed anesthesia with the patient or authorized representative who has indicated his/her understanding and acceptance.     Dental Advisory Given  Plan Discussed with: CRNA  Anesthesia Plan Comments:         Anesthesia Quick Evaluation  

## 2021-06-25 NOTE — Discharge Instructions (Signed)
No aspirin or NSAIDs for 24 hours Pantoprazole 40 mg by mouth 30 minutes before breakfast Resume other medications as before but do not take omeprazole OTC. High-fiber diet No driving for 24 hours Physician will call with biopsy results.

## 2021-06-26 LAB — SURGICAL PATHOLOGY

## 2021-07-01 ENCOUNTER — Encounter (INDEPENDENT_AMBULATORY_CARE_PROVIDER_SITE_OTHER): Payer: Self-pay | Admitting: *Deleted

## 2021-07-01 ENCOUNTER — Encounter (HOSPITAL_COMMUNITY): Payer: Self-pay | Admitting: Internal Medicine

## 2021-11-21 ENCOUNTER — Telehealth: Payer: Self-pay | Admitting: *Deleted

## 2021-11-21 DIAGNOSIS — Z23 Encounter for immunization: Secondary | ICD-10-CM | POA: Diagnosis not present

## 2021-11-21 DIAGNOSIS — L4052 Psoriatic arthritis mutilans: Secondary | ICD-10-CM | POA: Diagnosis not present

## 2021-11-21 DIAGNOSIS — M1389 Other specified arthritis, multiple sites: Secondary | ICD-10-CM | POA: Diagnosis not present

## 2021-11-21 DIAGNOSIS — M26621 Arthralgia of right temporomandibular joint: Secondary | ICD-10-CM | POA: Diagnosis not present

## 2021-11-21 NOTE — Telephone Encounter (Signed)
Message received from patient requesting to make an appt to see Dr. Marin Olp.  Call placed back to patient and patient notified per [place] NP's last office note that she has been released from our office and to continue her routine follow-ups with her PCP.  Pt states that she is not comfortable with that decision and would like to schedule one more appt with Dr. Marin Olp before she is discharged from the practice.  Dr. Marin Olp notified and order received for pt to come in to be seen.  Pt notified and is appreciative of assistance.  Message sent to scheduling.

## 2021-11-24 ENCOUNTER — Other Ambulatory Visit (HOSPITAL_COMMUNITY): Payer: Self-pay | Admitting: Internal Medicine

## 2021-11-24 DIAGNOSIS — Z1231 Encounter for screening mammogram for malignant neoplasm of breast: Secondary | ICD-10-CM

## 2021-11-27 DIAGNOSIS — H0102B Squamous blepharitis left eye, upper and lower eyelids: Secondary | ICD-10-CM | POA: Diagnosis not present

## 2021-11-27 DIAGNOSIS — H40013 Open angle with borderline findings, low risk, bilateral: Secondary | ICD-10-CM | POA: Diagnosis not present

## 2021-11-27 DIAGNOSIS — H35371 Puckering of macula, right eye: Secondary | ICD-10-CM | POA: Diagnosis not present

## 2021-11-27 DIAGNOSIS — H31092 Other chorioretinal scars, left eye: Secondary | ICD-10-CM | POA: Diagnosis not present

## 2021-11-27 DIAGNOSIS — H43811 Vitreous degeneration, right eye: Secondary | ICD-10-CM | POA: Diagnosis not present

## 2021-11-27 DIAGNOSIS — H04123 Dry eye syndrome of bilateral lacrimal glands: Secondary | ICD-10-CM | POA: Diagnosis not present

## 2021-11-27 DIAGNOSIS — H0102A Squamous blepharitis right eye, upper and lower eyelids: Secondary | ICD-10-CM | POA: Diagnosis not present

## 2021-12-09 ENCOUNTER — Encounter: Payer: Self-pay | Admitting: Radiology

## 2021-12-11 DIAGNOSIS — M79672 Pain in left foot: Secondary | ICD-10-CM | POA: Diagnosis not present

## 2021-12-11 DIAGNOSIS — Z6829 Body mass index (BMI) 29.0-29.9, adult: Secondary | ICD-10-CM | POA: Diagnosis not present

## 2021-12-11 DIAGNOSIS — L4050 Arthropathic psoriasis, unspecified: Secondary | ICD-10-CM | POA: Diagnosis not present

## 2021-12-11 DIAGNOSIS — E663 Overweight: Secondary | ICD-10-CM | POA: Diagnosis not present

## 2021-12-11 DIAGNOSIS — M1991 Primary osteoarthritis, unspecified site: Secondary | ICD-10-CM | POA: Diagnosis not present

## 2021-12-11 DIAGNOSIS — L401 Generalized pustular psoriasis: Secondary | ICD-10-CM | POA: Diagnosis not present

## 2021-12-23 DIAGNOSIS — H40013 Open angle with borderline findings, low risk, bilateral: Secondary | ICD-10-CM | POA: Diagnosis not present

## 2021-12-24 ENCOUNTER — Other Ambulatory Visit (HOSPITAL_COMMUNITY): Payer: Self-pay | Admitting: Physician Assistant

## 2021-12-24 DIAGNOSIS — M545 Low back pain, unspecified: Secondary | ICD-10-CM

## 2021-12-24 DIAGNOSIS — M25562 Pain in left knee: Secondary | ICD-10-CM | POA: Diagnosis not present

## 2021-12-24 DIAGNOSIS — M25561 Pain in right knee: Secondary | ICD-10-CM | POA: Diagnosis not present

## 2021-12-25 ENCOUNTER — Inpatient Hospital Stay (HOSPITAL_BASED_OUTPATIENT_CLINIC_OR_DEPARTMENT_OTHER): Payer: Medicare Other | Admitting: Hematology & Oncology

## 2021-12-25 ENCOUNTER — Other Ambulatory Visit: Payer: Self-pay | Admitting: *Deleted

## 2021-12-25 ENCOUNTER — Encounter: Payer: Self-pay | Admitting: Hematology & Oncology

## 2021-12-25 ENCOUNTER — Inpatient Hospital Stay: Payer: Medicare Other | Attending: Hematology & Oncology

## 2021-12-25 VITALS — BP 135/79 | HR 107 | Temp 98.8°F | Resp 18 | Wt 166.0 lb

## 2021-12-25 DIAGNOSIS — Z923 Personal history of irradiation: Secondary | ICD-10-CM | POA: Diagnosis not present

## 2021-12-25 DIAGNOSIS — Z9223 Personal history of estrogen therapy: Secondary | ICD-10-CM | POA: Insufficient documentation

## 2021-12-25 DIAGNOSIS — Z170 Estrogen receptor positive status [ER+]: Secondary | ICD-10-CM | POA: Insufficient documentation

## 2021-12-25 DIAGNOSIS — C50912 Malignant neoplasm of unspecified site of left female breast: Secondary | ICD-10-CM

## 2021-12-25 DIAGNOSIS — Z853 Personal history of malignant neoplasm of breast: Secondary | ICD-10-CM | POA: Diagnosis not present

## 2021-12-25 LAB — CBC WITH DIFFERENTIAL (CANCER CENTER ONLY)
Abs Immature Granulocytes: 0.13 10*3/uL — ABNORMAL HIGH (ref 0.00–0.07)
Basophils Absolute: 0.1 10*3/uL (ref 0.0–0.1)
Basophils Relative: 1 %
Eosinophils Absolute: 0.1 10*3/uL (ref 0.0–0.5)
Eosinophils Relative: 1 %
HCT: 44 % (ref 36.0–46.0)
Hemoglobin: 14.6 g/dL (ref 12.0–15.0)
Immature Granulocytes: 1 %
Lymphocytes Relative: 15 %
Lymphs Abs: 1.7 10*3/uL (ref 0.7–4.0)
MCH: 30.7 pg (ref 26.0–34.0)
MCHC: 33.2 g/dL (ref 30.0–36.0)
MCV: 92.6 fL (ref 80.0–100.0)
Monocytes Absolute: 0.7 10*3/uL (ref 0.1–1.0)
Monocytes Relative: 6 %
Neutro Abs: 9.2 10*3/uL — ABNORMAL HIGH (ref 1.7–7.7)
Neutrophils Relative %: 76 %
Platelet Count: 328 10*3/uL (ref 150–400)
RBC: 4.75 MIL/uL (ref 3.87–5.11)
RDW: 13.3 % (ref 11.5–15.5)
WBC Count: 11.9 10*3/uL — ABNORMAL HIGH (ref 4.0–10.5)
nRBC: 0 % (ref 0.0–0.2)

## 2021-12-25 LAB — CMP (CANCER CENTER ONLY)
ALT: 17 U/L (ref 0–44)
AST: 17 U/L (ref 15–41)
Albumin: 4.4 g/dL (ref 3.5–5.0)
Alkaline Phosphatase: 67 U/L (ref 38–126)
Anion gap: 11 (ref 5–15)
BUN: 18 mg/dL (ref 8–23)
CO2: 27 mmol/L (ref 22–32)
Calcium: 8.9 mg/dL (ref 8.9–10.3)
Chloride: 96 mmol/L — ABNORMAL LOW (ref 98–111)
Creatinine: 0.79 mg/dL (ref 0.44–1.00)
GFR, Estimated: >60 mL/min (ref >60)
Glucose, Bld: 127 mg/dL — ABNORMAL HIGH (ref 70–99)
Potassium: 4.3 mmol/L (ref 3.5–5.1)
Sodium: 134 mmol/L — ABNORMAL LOW (ref 135–145)
Total Bilirubin: 0.4 mg/dL (ref 0.3–1.2)
Total Protein: 7.3 g/dL (ref 6.5–8.1)

## 2021-12-25 LAB — LACTATE DEHYDROGENASE: LDH: 175 U/L (ref 98–192)

## 2021-12-25 NOTE — Progress Notes (Signed)
Hematology and Oncology Follow Up Visit  Lauren [name] [id number] 06-20-1945 76 y.o. 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  We see her yearly.  This to be the last time that we have to see her.  It has is been 20 years.  She is done incredibly well.  She really has no problems although she does have issues with spinal stenosis.  She has little bit of weakness in the legs.  She is going to have an MRI of the back.  Hopefully, she will not need surgery.  She currently is on some prednisone.  This is why her white cell count is on the high side.  She has had no problems with cough or shortness of breath.  There is been no problems with COVID.  She has had no change in bowel or bladder habits.  She has had no rashes.  There is been no bleeding.  Currently, I would say performance status is probably ECOG 1.    Medications:  Allergies as of 12/25/2021   No Known Allergies      Medication List        Accurate as of December 25, 2021  2:31 PM. If you have any questions, ask your nurse or doctor.          acetaminophen 500 MG tablet Commonly known as: TYLENOL Take 1,000 mg by mouth every 6 (six) hours as needed for moderate pain.   Calcium 600+D 600-20 MG-MCG Tabs Generic drug: Calcium Carb-Cholecalciferol Take 1 tablet by mouth every evening.   COSAMIN DS PO Take 1 tablet by mouth in the morning and at bedtime. 1500-1200   ezetimibe 10 MG tablet Commonly known as: ZETIA Take 10 mg by mouth every evening.   latanoprost 0.005 % ophthalmic solution Commonly known as: XALATAN Place 1 drop into both eyes at bedtime.   Magnesium 500 MG Tabs Take 500 mg by mouth daily in the afternoon.   pantoprazole 40 MG tablet Commonly known as: PROTONIX Take 1 tablet (40 mg total) by mouth daily before  breakfast.   Synthroid 112 MCG tablet Generic drug: levothyroxine Take 112 mcg by mouth daily before breakfast.   Tums 500 MG chewable tablet Generic drug: calcium carbonate Chew 1 tablet by mouth 3 (three) times daily as needed for indigestion or heartburn.   Vitamin B-12 2500 MCG Subl Take 2,500 mcg by mouth every evening.        Allergies: No Known Allergies  Past Medical History, Surgical history, Social history, and Family History were reviewed and updated.  Review of Systems: Review of Systems  Constitutional: Negative.   HENT: Negative.    Eyes: Negative.   Respiratory: Negative.    Cardiovascular: Negative.   Gastrointestinal: Negative.   Genitourinary: Negative.   Musculoskeletal:  Positive for back pain. Negative for joint pain.  Skin: Negative.   Neurological: Negative.   Endo/Heme/Allergies: Negative.   Psychiatric/Behavioral: Negative.       Physical Exam:  vitals were not taken for this visit.   Wt Readings from Last 3 Encounters:  06/23/21 160 lb (72.6 kg)  12/03/20 161 lb (73 kg)  12/05/19 159 lb (72.1 kg)    Physical Exam Vitals reviewed.  Constitutional:      Comments: Her breast exam shows  right breast with no masses, edema or erythema.  There is no right axillary adenopathy.  Left breast shows a contraction of the breast.  She has little bit of firmness of the breast where she had radiation and surgery.  She has a lumpectomy at about the 2 o'clock position.  There is no erythema.  There is no warmth.  There is no left axillary adenopathy.  HENT:     Head: Normocephalic and atraumatic.  Eyes:     Pupils: Pupils are equal, round, and reactive to light.  Cardiovascular:     Rate and Rhythm: Normal rate and regular rhythm.     Heart sounds: Normal heart sounds.  Pulmonary:     Effort: Pulmonary effort is normal.     Breath sounds: Normal breath sounds.  Abdominal:     General: Bowel sounds are normal.     Palpations: Abdomen is soft.   Musculoskeletal:        General: No tenderness or deformity. Normal range of motion.     Cervical back: Normal range of motion.  Lymphadenopathy:     Cervical: No cervical adenopathy.  Skin:    General: Skin is warm and dry.     Findings: No erythema or rash.  Neurological:     Mental Status: She is alert and oriented to person, place, and time.  Psychiatric:        Behavior: Behavior normal.        Thought Content: Thought content normal.        Judgment: Judgment normal.      Lab Results  Component Value Date   WBC 7.1 12/03/2020   HGB 14.7 12/03/2020   HCT 44.2 12/03/2020   MCV 92.3 12/03/2020   PLT 284 12/03/2020   No results found for: "FERRITIN", "IRON", "TIBC", "UIBC", "IRONPCTSAT" Lab Results  Component Value Date   RBC 4.79 12/03/2020   No results found for: "KPAFRELGTCHN", "LAMBDASER", "KAPLAMBRATIO" No results found for: "IGGSERUM", "IGA", "IGMSERUM" No results found for: "TOTALPROTELP", "ALBUMINELP", "A1GS", "A2GS", "BETS", "BETA2SER", "GAMS", "MSPIKE", "SPEI"   Chemistry      Component Value Date/Time   NA 135 12/03/2020 0952   NA 143 11/02/2016 1335   NA 141 08/30/2015 1149   K 4.6 12/03/2020 0952   K 4.4 11/02/2016 1335   K 4.2 08/30/2015 1149   CL 100 12/03/2020 0952   CL 104 11/02/2016 1335   CO2 28 12/03/2020 0952   CO2 27 11/02/2016 1335   CO2 27 08/30/2015 1149   BUN 16 12/03/2020 0952   BUN 11 11/02/2016 1335   BUN 16.1 08/30/2015 1149   CREATININE 0.66 12/03/2020 0952   CREATININE 0.9 11/02/2016 1335   CREATININE 0.6 08/30/2015 1149      Component Value Date/Time   CALCIUM 9.6 12/03/2020 0952   CALCIUM 9.2 11/02/2016 1335   CALCIUM 9.3 08/30/2015 1149   ALKPHOS 80 12/03/2020 0952   ALKPHOS 94 (H) 11/02/2016 1335   ALKPHOS 110 08/30/2015 1149   AST 20 12/03/2020 0952   AST 26 08/30/2015 1149   ALT 16 12/03/2020 0952   ALT 25 11/02/2016 1335   ALT 33 08/30/2015 1149   BILITOT 0.5 12/03/2020 0952   BILITOT 0.55 08/30/2015 1149        Impression and Plan: Lauren [name] is a very pleasant 76 yo caucasian female with history of stage I infiltrating lobular carcinoma of the left breast. She had lumpectomy and radiation followed by Tamoxifen which she completed in 2004.  It has been a true pleasure to have seen her.  Has been a true privilege to have been able to treat her and to help her out.  She has done incredibly well.  I think the risk of recurrence at this point is easily less than 1%.  I voiced enjoyed talking with Ms. Danielsen.  She has always been so delightful to talk with. [name], MD 12/14/20232:31 PM

## 2021-12-26 DIAGNOSIS — Z6830 Body mass index (BMI) 30.0-30.9, adult: Secondary | ICD-10-CM | POA: Diagnosis not present

## 2021-12-26 DIAGNOSIS — R29818 Other symptoms and signs involving the nervous system: Secondary | ICD-10-CM | POA: Diagnosis not present

## 2021-12-29 ENCOUNTER — Ambulatory Visit (HOSPITAL_COMMUNITY)
Admission: RE | Admit: 2021-12-29 | Discharge: 2021-12-29 | Disposition: A | Discharge status: Home / Self Care | Payer: Medicare Other | Source: Ambulatory Visit | Attending: Physician Assistant | Admitting: Physician Assistant

## 2021-12-29 DIAGNOSIS — M545 Low back pain, unspecified: Secondary | ICD-10-CM | POA: Diagnosis not present

## 2021-12-29 DIAGNOSIS — M5126 Other intervertebral disc displacement, lumbar region: Secondary | ICD-10-CM | POA: Diagnosis not present

## 2021-12-30 DIAGNOSIS — M79672 Pain in left foot: Secondary | ICD-10-CM | POA: Diagnosis not present

## 2021-12-30 DIAGNOSIS — M76822 Posterior tibial tendinitis, left leg: Secondary | ICD-10-CM | POA: Diagnosis not present

## 2022-01-01 DIAGNOSIS — E785 Hyperlipidemia, unspecified: Secondary | ICD-10-CM | POA: Diagnosis not present

## 2022-01-01 DIAGNOSIS — C50912 Malignant neoplasm of unspecified site of left female breast: Secondary | ICD-10-CM | POA: Diagnosis not present

## 2022-01-01 DIAGNOSIS — Z79899 Other long term (current) drug therapy: Secondary | ICD-10-CM | POA: Diagnosis not present

## 2022-01-01 DIAGNOSIS — M1991 Primary osteoarthritis, unspecified site: Secondary | ICD-10-CM | POA: Diagnosis not present

## 2022-01-01 DIAGNOSIS — M79672 Pain in left foot: Secondary | ICD-10-CM | POA: Diagnosis not present

## 2022-01-01 DIAGNOSIS — E039 Hypothyroidism, unspecified: Secondary | ICD-10-CM | POA: Diagnosis not present

## 2022-01-01 DIAGNOSIS — L4050 Arthropathic psoriasis, unspecified: Secondary | ICD-10-CM | POA: Diagnosis not present

## 2022-01-01 DIAGNOSIS — E663 Overweight: Secondary | ICD-10-CM | POA: Diagnosis not present

## 2022-01-01 DIAGNOSIS — X32XXXD Exposure to sunlight, subsequent encounter: Secondary | ICD-10-CM | POA: Diagnosis not present

## 2022-01-01 DIAGNOSIS — L570 Actinic keratosis: Secondary | ICD-10-CM | POA: Diagnosis not present

## 2022-01-01 DIAGNOSIS — L401 Generalized pustular psoriasis: Secondary | ICD-10-CM | POA: Diagnosis not present

## 2022-01-01 DIAGNOSIS — L4059 Other psoriatic arthropathy: Secondary | ICD-10-CM | POA: Diagnosis not present

## 2022-01-01 DIAGNOSIS — K219 Gastro-esophageal reflux disease without esophagitis: Secondary | ICD-10-CM | POA: Diagnosis not present

## 2022-01-01 DIAGNOSIS — Z1283 Encounter for screening for malignant neoplasm of skin: Secondary | ICD-10-CM | POA: Diagnosis not present

## 2022-01-01 DIAGNOSIS — Z6829 Body mass index (BMI) 29.0-29.9, adult: Secondary | ICD-10-CM | POA: Diagnosis not present

## 2022-01-01 DIAGNOSIS — L821 Other seborrheic keratosis: Secondary | ICD-10-CM | POA: Diagnosis not present

## 2022-01-01 DIAGNOSIS — D225 Melanocytic nevi of trunk: Secondary | ICD-10-CM | POA: Diagnosis not present

## 2022-01-01 DIAGNOSIS — L814 Other melanin hyperpigmentation: Secondary | ICD-10-CM | POA: Diagnosis not present

## 2022-01-09 DIAGNOSIS — M48062 Spinal stenosis, lumbar region with neurogenic claudication: Secondary | ICD-10-CM | POA: Diagnosis not present

## 2022-01-09 DIAGNOSIS — Z6831 Body mass index (BMI) 31.0-31.9, adult: Secondary | ICD-10-CM | POA: Diagnosis not present

## 2022-01-09 DIAGNOSIS — M4316 Spondylolisthesis, lumbar region: Secondary | ICD-10-CM | POA: Diagnosis not present

## 2022-01-13 ENCOUNTER — Other Ambulatory Visit: Payer: Self-pay | Admitting: Neurosurgery

## 2022-01-13 DIAGNOSIS — M48062 Spinal stenosis, lumbar region with neurogenic claudication: Secondary | ICD-10-CM

## 2022-01-14 ENCOUNTER — Ambulatory Visit (HOSPITAL_COMMUNITY)
Admission: RE | Admit: 2022-01-14 | Discharge: 2022-01-14 | Disposition: A | Discharge status: Home / Self Care | Payer: Medicare Other | Source: Ambulatory Visit | Attending: Internal Medicine | Admitting: Internal Medicine

## 2022-01-14 DIAGNOSIS — Z1231 Encounter for screening mammogram for malignant neoplasm of breast: Secondary | ICD-10-CM | POA: Insufficient documentation

## 2022-01-15 NOTE — Discharge Instructions (Signed)

## 2022-01-16 ENCOUNTER — Ambulatory Visit (HOSPITAL_COMMUNITY): Payer: Medicare Other

## 2022-01-16 ENCOUNTER — Ambulatory Visit
Admission: RE | Admit: 2022-01-16 | Discharge: 2022-01-16 | Disposition: A | Discharge status: Home / Self Care | Payer: Medicare Other | Source: Ambulatory Visit | Attending: Neurosurgery | Admitting: Neurosurgery

## 2022-01-16 DIAGNOSIS — M48062 Spinal stenosis, lumbar region with neurogenic claudication: Secondary | ICD-10-CM

## 2022-01-16 DIAGNOSIS — M4316 Spondylolisthesis, lumbar region: Secondary | ICD-10-CM | POA: Diagnosis not present

## 2022-01-16 DIAGNOSIS — M48061 Spinal stenosis, lumbar region without neurogenic claudication: Secondary | ICD-10-CM | POA: Diagnosis not present

## 2022-01-16 DIAGNOSIS — M5126 Other intervertebral disc displacement, lumbar region: Secondary | ICD-10-CM | POA: Diagnosis not present

## 2022-01-16 MED ORDER — MEPERIDINE HCL 50 MG/ML IJ SOLN: 50.0000 mg | Freq: Once | INTRAMUSCULAR | Status: DC | PRN

## 2022-01-16 MED ORDER — DIAZEPAM 5 MG PO TABS
5.0000 mg | ORAL_TABLET | Freq: Once | ORAL | Status: AC
  Administered 2022-01-16: 5 mg via ORAL

## 2022-01-16 MED ORDER — ONDANSETRON HCL 4 MG/2ML IJ SOLN: 4.0000 mg | Freq: Once | INTRAMUSCULAR | Status: DC | PRN

## 2022-01-16 MED ORDER — IOPAMIDOL (ISOVUE-M 200) INJECTION 41%
20.0000 mL | Freq: Once | INTRAMUSCULAR | Status: AC
  Administered 2022-01-16: 20 mL via INTRATHECAL

## 2022-01-19 ENCOUNTER — Other Ambulatory Visit (HOSPITAL_COMMUNITY): Payer: Self-pay | Admitting: Internal Medicine

## 2022-01-19 ENCOUNTER — Other Ambulatory Visit: Payer: Self-pay | Admitting: Internal Medicine

## 2022-01-19 DIAGNOSIS — Z6830 Body mass index (BMI) 30.0-30.9, adult: Secondary | ICD-10-CM | POA: Diagnosis not present

## 2022-01-19 DIAGNOSIS — R928 Other abnormal and inconclusive findings on diagnostic imaging of breast: Secondary | ICD-10-CM

## 2022-01-19 DIAGNOSIS — M48062 Spinal stenosis, lumbar region with neurogenic claudication: Secondary | ICD-10-CM | POA: Diagnosis not present

## 2022-01-20 ENCOUNTER — Ambulatory Visit (HOSPITAL_COMMUNITY)
Admission: RE | Admit: 2022-01-20 | Discharge: 2022-01-20 | Disposition: A | Discharge status: Home / Self Care | Payer: Medicare Other | Source: Ambulatory Visit | Attending: Internal Medicine | Admitting: Internal Medicine

## 2022-01-20 ENCOUNTER — Encounter (HOSPITAL_COMMUNITY): Payer: Self-pay

## 2022-01-20 ENCOUNTER — Other Ambulatory Visit (HOSPITAL_COMMUNITY): Payer: Self-pay | Admitting: Internal Medicine

## 2022-01-20 DIAGNOSIS — R922 Inconclusive mammogram: Secondary | ICD-10-CM | POA: Diagnosis not present

## 2022-01-20 DIAGNOSIS — R928 Other abnormal and inconclusive findings on diagnostic imaging of breast: Secondary | ICD-10-CM | POA: Insufficient documentation

## 2022-01-20 DIAGNOSIS — M1611 Unilateral primary osteoarthritis, right hip: Secondary | ICD-10-CM | POA: Diagnosis not present

## 2022-01-23 ENCOUNTER — Other Ambulatory Visit: Payer: Medicare Other

## 2022-01-23 DIAGNOSIS — L4050 Arthropathic psoriasis, unspecified: Secondary | ICD-10-CM | POA: Diagnosis not present

## 2022-01-27 ENCOUNTER — Ambulatory Visit (HOSPITAL_COMMUNITY)
Admission: RE | Admit: 2022-01-27 | Discharge: 2022-01-27 | Disposition: A | Discharge status: Home / Self Care | Payer: Medicare Other | Source: Ambulatory Visit | Attending: Internal Medicine | Admitting: Internal Medicine

## 2022-01-27 ENCOUNTER — Encounter (HOSPITAL_COMMUNITY): Payer: Self-pay

## 2022-01-27 ENCOUNTER — Other Ambulatory Visit (HOSPITAL_COMMUNITY): Payer: Self-pay | Admitting: Internal Medicine

## 2022-01-27 DIAGNOSIS — R928 Other abnormal and inconclusive findings on diagnostic imaging of breast: Secondary | ICD-10-CM

## 2022-01-27 DIAGNOSIS — N6311 Unspecified lump in the right breast, upper outer quadrant: Secondary | ICD-10-CM | POA: Diagnosis not present

## 2022-01-27 DIAGNOSIS — Z170 Estrogen receptor positive status [ER+]: Secondary | ICD-10-CM | POA: Diagnosis not present

## 2022-01-27 DIAGNOSIS — C50411 Malignant neoplasm of upper-outer quadrant of right female breast: Secondary | ICD-10-CM | POA: Diagnosis not present

## 2022-01-27 HISTORY — PX: BREAST BIOPSY: SHX20

## 2022-01-27 MED ORDER — LIDOCAINE HCL (PF) 2 % IJ SOLN
10.0000 mL | Freq: Once | INTRAMUSCULAR | Status: AC
  Administered 2022-01-27: 10 mL

## 2022-01-27 MED ORDER — LIDOCAINE-EPINEPHRINE (PF) 1 %-1:200000 IJ SOLN
10.0000 mL | Freq: Once | INTRAMUSCULAR | Status: AC
  Administered 2022-01-27: 10 mL via INTRADERMAL

## 2022-01-27 NOTE — Progress Notes (Signed)
PT tolerated right breast biopsy well today with no acute distress noted. PT verbalized understanding of discharge instructions. PT ambulated back to the mammogram area this time and given ice packs.

## 2022-01-30 LAB — SURGICAL PATHOLOGY

## 2022-02-13 ENCOUNTER — Other Ambulatory Visit: Payer: Self-pay | Admitting: General Surgery

## 2022-02-13 ENCOUNTER — Telehealth: Payer: Self-pay | Admitting: Genetic Counselor

## 2022-02-13 DIAGNOSIS — Z170 Estrogen receptor positive status [ER+]: Secondary | ICD-10-CM | POA: Diagnosis not present

## 2022-02-13 DIAGNOSIS — C50411 Malignant neoplasm of upper-outer quadrant of right female breast: Secondary | ICD-10-CM

## 2022-02-13 NOTE — Telephone Encounter (Signed)
Scheduled appt per 2/2 referral. Pt is aware of appt date and time. Pt is aware to arrive 15 mins prior to appt time and to bring and updated insurance card. Pt is aware of appt location.

## 2022-02-16 NOTE — Progress Notes (Incomplete)
Location of Breast Cancer: Malignant neoplasm of upper-outer quadrant of right breast in female, estrogen receptor positive   History of Present Illness: 77 yof with prior history of left breast cancer in early 2000s. Treated with lumpectomy/sn biopsy then with XRT/tam for five years. She has done well from this. 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 She was just released by Dr Marin Olp. She had a screening mm that shows b density breast tissue. This showed a ruoq asymmetry. US showed a 5x3x5 mm mass. Axilla was negative. 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 She is here to discuss options.   Histology per Pathology Report:  FINAL MICROSCOPIC DIAGNOSIS:  A. BREAST, RIGHT, MASS, 9:30, BIOPSY: - Invasive mammary carcinoma, see note  - Mammary carcinoma in situ, intermediate grade  - Tubule formation: Score 3  - Nuclear pleomorphism: Score 2  - Mitotic count: Score 1  - Total score: 6  - Overall Grade: 2  - Lymphovascular invasion: Not identified  - Cancer Length: 0.6 cm  - Calcifications: Not identified  - Other findings: None  Note:  Dr. Vic Ripper reviewed the case and concurs with the interpretation.  A breast prognostic profile (ER, PR, Ki-67 and HER2) and E-cad are pending and will be reported in an addendum. Dr. Marin Olp was notified on 01/28/2022.  GROSS DESCRIPTION:  A. Received in formalin labeled with the patient's name Lauren [name]) and "RT breast are four pieces of yellow-tan fibrofatty tissue ranging from 1.2 x 0.2 x 0.2 cm to 2.3 x 0.2 x 0.2 cm, submitted in toto in a single cassette. TIF 13:20, CIT <1 minute (ribbon clip).  (LEF 01/27/2022)   Final Diagnosis performed by Tilford Pillar, MD.   Electronically signed 01/28/2022 Technical component performed at [facility] Status:  Biopsy is a grade II ILC that is  100% er pos, 80% pr pos , her 2 negative and Ki 1%. She is here to discuss options.   Did patient present with symptoms (if so, please note symptoms) or was this found on screening mammography?: routine mammogram  Past/Anticipated interventions by surgeon, if any: Assessment and Plan:  Diagnoses and [name] orders for this visit:  Malignant neoplasm of upper-outer quadrant of right breast in female, estrogen receptor positive  Right breast seed guided lumpectomy  We discussed the staging and pathophysiology of breast cancer. We discussed [name] of the different options for treatment for breast cancer including surgery, chemotherapy, radiation therapy, Herceptin, and antiestrogen therapy. Will refer her to see genetics at cancer center at some point- this will not alter her surgery.  We discussed a sentinel lymph node biopsy but based on choosing wisely guidelines and now SOUND trial data I think we can omit a sn biopsy for her given the size, biology and normal ax u/s. She was agreeable to that.  We discussed the options for treatment of the breast cancer which included lumpectomy versus a mastectomy. We discussed the performance of the lumpectomy with radioactive seed placement. We discussed a 5-10% chance of a positive margin requiring reexcision in the operating room. We also discussed that she would have a discussion about radiation therapy if she undergoes lumpectomy. I will refer her to see Dr Isidore Moos. We discussed mastectomy and the postoperative care for that as well. Mastectomy can be followed by reconstruction. The decision  for lumpectomy vs mastectomy has no impact on decision for chemotherapy. Most mastectomy patients will not need radiation therapy. We discussed that there is no difference in her survival whether she undergoes lumpectomy with radiation therapy or antiestrogen therapy versus a mastectomy. There is also no real difference between her recurrence in the breast. We discussed  possible oncotype pending final size of tumor as well  Will work to schedule her soon  We discussed the risks of operation including bleeding, infection, possible reoperation. She understands her further therapy will be based on what her stages at the time of her operation.   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 She had lumpectomy and radiation followed by Tamoxifen which she completed in 2004.    It has been a true pleasure to have seen her.  Has been a true privilege to have been able to treat her and to help her out.  She has done incredibly well.   I think the risk of recurrence at this point is easily less than 1%.   I voiced enjoyed talking with [name].  She has always been so delightful to talk with. [name], MD 12/14/20232:31 PM   Lymphedema issues, if any:  {:18581} {t:21944}   Pain issues, if any:  {:18581} {PAIN DESCRIPTION:[id number]}  SAFETY ISSUES: Prior radiation? {:18581} Pacemaker/ICD? {:18581} Possible current pregnancy?{:18581} Is the patient on methotrexate? {:18581}  Current Complaints / other details:  ***

## 2022-02-16 NOTE — Progress Notes (Signed)
Radiation Oncology         [phone] ________________________________  Initial Outpatient Consultation  Name: Lauren [name] MRN: [id number]  Date: 02/17/2022  DOB: 1945/11/08  RA:[id number], Carloyn Manner, MD  Rolm Bookbinder, MD   REFERRING PHYSICIAN: Rolm Bookbinder, MD  DIAGNOSIS:    ICD-10-CM   1. Malignant neoplasm of upper-outer quadrant of right breast in female, estrogen receptor positive ([place])  C50.411    Z17.0        Cancer Staging  No matching staging information was found for the patient.  Stage *** Right Breast UOQ, Invasive Lobular Carcinoma, ER+ / PR+ / Her2-, Grade 2  History of Stage I (T1 N0 M0) lobular carcinoma of the left breast: s/p lumpectomy (performed in 1999), radiation, and tamoxifen x 5 years completed in 2004.    CHIEF COMPLAINT: Here to discuss management of right breast cancer  HISTORY OF PRESENT ILLNESS::Lauren [name] is a 77 y.o. female who presented with a right breast abnormality on the following imaging: bilateral screening mammogram on the date of 01/14/22.  No symptoms, if any, were reported at that time. However the patient has a notable history of left breast cancer diagnosed in the late 90's. Right breast mammogram and right breast ultrasound on 01/20/22 showed an indeterminate mass in the 9:30 o'clock right breast measuring 0.5 x 0.3 x 0.5 cm. [place] otherwise showed no evidence of lymphadenopathy in the right axilla.  Biopsy of the 9:30 o'clock right breast on date of 01/27/22 showed grade 2 invasive lobular carcinoma measuring 0.6 cm in the greatest linear extent of the sample (negative for LVI).  ER status: 100% positive and PR status 80% positive, both with strong staining intensity; Proliferation marker Ki67 at 1%; Her2 status negative; Grade 2.  The patient was referred to Dr. Donne Hazel on 02/13/22. Following a detailed discussion of the risks and benefits, the patient has opted to proceed with breast conserving surgery. Given the tumor  size, biology, and negative axillary findings, SLN biopsies will not be performed.   ***  PREVIOUS RADIATION THERAPY: Yes  History of Stage I (T1 N0 M0) lobular carcinoma of the left breast: s/p lumpectomy (performed in 1999), radiation, and tamoxifen x 5 years completed in 2004.    PAST MEDICAL HISTORY:  has a past medical history of Arthritis.    PAST SURGICAL HISTORY: Past Surgical History:  Procedure Laterality Date   ABDOMINAL HYSTERECTOMY     BIOPSY  06/25/2021   Procedure: BIOPSY;  Surgeon: Rogene Houston, MD;  [facility];  Service: Endoscopy;;   BREAST BIOPSY Right 01/27/2022   [place] RT BREAST BX W LOC DEV 1ST LESION IMG BX [place] US GUIDE 01/27/2022 Jamie Kato, MD [facility]-ULTRASOUND   BREAST LUMPECTOMY Left 1999   COLONOSCOPY WITH PROPOFOL N/A 06/25/2021   Procedure: COLONOSCOPY WITH PROPOFOL;  Surgeon: Rogene Houston, MD;  Location: [facility];  Service: Endoscopy;  Laterality: N/A;  1010   ESOPHAGOGASTRODUODENOSCOPY (EGD) WITH PROPOFOL N/A 06/25/2021   Procedure: ESOPHAGOGASTRODUODENOSCOPY (EGD) WITH PROPOFOL;  Surgeon: Rogene Houston, MD;  [facility];  Service: Endoscopy;  Laterality: N/A;   MALONEY DILATION  06/25/2021   Procedure: MALONEY DILATION;  Surgeon: Rogene Houston, MD;  Location[facility];  Service: Endoscopy;;    FAMILY HISTORY: family history is not on file.  SOCIAL HISTORY:  reports that she has quit smoking. She has never used smokeless tobacco. She reports current alcohol use of about 1.0 standard drink of alcohol  per week. She reports that she does not currently use drugs.  ALLERGIES: Patient has no known allergies.  MEDICATIONS:  Current Outpatient Medications  Medication Sig Dispense Refill   acetaminophen (TYLENOL) 500 MG tablet Take 1,000 mg by mouth every 6 (six) hours as needed for moderate pain.     Calcium Carb-Cholecalciferol (CALCIUM 600+D) 600-800 MG-UNIT TABS Take 1 tablet by mouth every evening.      calcium carbonate (TUMS) 500 MG chewable tablet Chew 1 tablet by mouth 3 (three) times daily as needed for indigestion or heartburn.     Cyanocobalamin (VITAMIN B-12) 2500 MCG SUBL Take 2,500 mcg by mouth every evening.     ezetimibe (ZETIA) 10 MG tablet Take 10 mg by mouth every evening.     Glucosamine-Chondroitin (COSAMIN DS PO) Take 1 tablet by mouth in the morning and at bedtime. 1500-1200     latanoprost (XALATAN) 0.005 % ophthalmic solution Place 1 drop into both eyes at bedtime.     Magnesium 500 MG TABS Take 500 mg by mouth daily in the afternoon.     pantoprazole (PROTONIX) 40 MG tablet Take 1 tablet (40 mg total) by mouth daily before breakfast. 90 tablet 1   SYNTHROID 112 MCG tablet Take 112 mcg by mouth daily before breakfast.     No current facility-administered medications for this encounter.    REVIEW OF SYSTEMS: As above in HPI.   PHYSICAL EXAM:  vitals were not taken for this visit.   General: Alert and oriented, in no acute distress HEENT: Head is normocephalic. Extraocular movements are intact. Oropharynx is clear. Neck: Neck is supple, no palpable cervical or supraclavicular lymphadenopathy. Heart: Regular in rate and rhythm with no murmurs, rubs, or gallops. Chest: Clear to auscultation bilaterally, with no rhonchi, wheezes, or rales. Abdomen: Soft, nontender, nondistended, with no rigidity or guarding. Extremities: No cyanosis or edema. Lymphatics: see Neck Exam Skin: No concerning lesions. Musculoskeletal: symmetric strength and muscle tone throughout. Neurologic: Cranial nerves II through XII are grossly intact. No obvious focalities. Speech is fluent. Coordination is intact. Psychiatric: Judgment and insight are intact. Affect is appropriate. Breasts: *** . No other palpable masses appreciated in the breasts or axillae *** .    ECOG = ***  0 - Asymptomatic (Fully active, able to carry on all predisease activities without restriction)  1 - Symptomatic  but completely ambulatory (Restricted in physically strenuous activity but ambulatory and able to carry out work of a light or sedentary nature. For example, light housework, office work)  2 - Symptomatic, <50% in bed during the day (Ambulatory and capable of all self care but unable to carry out any work activities. Up and about more than 50% of waking hours)  3 - Symptomatic, >50% in bed, but not bedbound (Capable of only limited self-care, confined to bed or chair 50% or more of waking hours)  4 - Bedbound (Completely disabled. Cannot carry on any self-care. Totally confined to bed or chair)  5 - Death   Eustace Pen MM, Creech RH, Tormey DC, et al. [phone]). "Toxicity and response criteria of the [facility]". [place] Oncol. 5 (6): 649-55   LABORATORY DATA:  Lab Results  Component Value Date   WBC 11.9 (H) 12/25/2021   HGB 14.6 12/25/2021   HCT 44.0 12/25/2021   MCV 92.6 12/25/2021   PLT 328 12/25/2021   CMP     Component Value Date/Time   NA 134 (Lauren) 12/25/2021 1421   NA 143 11/02/2016  1335   NA 141 08/30/2015 1149   K 4.3 12/25/2021 1421   K 4.4 11/02/2016 1335   K 4.2 08/30/2015 1149   CL 96 (Lauren) 12/25/2021 1421   CL 104 11/02/2016 1335   CO2 27 12/25/2021 1421   CO2 27 11/02/2016 1335   CO2 27 08/30/2015 1149   GLUCOSE 127 (H) 12/25/2021 1421   GLUCOSE 110 11/02/2016 1335   BUN 18 12/25/2021 1421   BUN 11 11/02/2016 1335   BUN 16.1 08/30/2015 1149   CREATININE 0.79 12/25/2021 1421   CREATININE 0.9 11/02/2016 1335   CREATININE 0.6 08/30/2015 1149   CALCIUM 8.9 12/25/2021 1421   CALCIUM 9.2 11/02/2016 1335   CALCIUM 9.3 08/30/2015 1149   PROT 7.3 12/25/2021 1421   PROT 7.3 11/02/2016 1335   PROT 7.4 08/30/2015 1149   ALBUMIN 4.4 12/25/2021 1421   ALBUMIN 3.9 11/02/2016 1335   ALBUMIN 3.8 08/30/2015 1149   AST 17 12/25/2021 1421   AST 26 08/30/2015 1149   ALT 17 12/25/2021 1421   ALT 25 11/02/2016 1335   ALT 33 08/30/2015 1149   ALKPHOS  67 12/25/2021 1421   ALKPHOS 94 (H) 11/02/2016 1335   ALKPHOS 110 08/30/2015 1149   BILITOT 0.4 12/25/2021 1421   BILITOT 0.55 08/30/2015 1149   GFRNONAA >60 12/25/2021 1421   GFRAA >60 12/05/2018 0950         RADIOGRAPHY: [place] RT BREAST BX W LOC DEV 1ST LESION IMG BX SPEC US GUIDE  Addendum Date: 01/30/2022   ADDENDUM REPORT: 01/30/2022 07:52 ADDENDUM: PATHOLOGY revealed: A. BREAST, RIGHT, MASS, 9:30, BIOPSY: - Invasive mammary carcinoma. - Mammary carcinoma in situ, intermediate grade - Overall Grade: 2. - Lymphovascular invasion: Not identified. - Cancer Length: 0.6 cm. - Calcifications: Not identified. Pathology results are CONCORDANT with imaging findings, per Dr. Ammie Ferrier. Pathology results and recommendations were discussed with patient via telephone on 01/28/2022. Patient reported biopsy site doing well with no adverse symptoms, and only slight tenderness at the site. Post biopsy care instructions were reviewed, questions were answered and my direct phone number was provided. Patient was instructed to call [facility] Mammography Department for any additional questions or concerns related to biopsy site. RECOMMENDATION: Surgical and oncological consultation. Request for surgical and oncological consultation was relayed to Kathi Der RT at [facility] Mammography Department by Electa Sniff RN on 01/28/2022. Pathology results reported by Electa Sniff RN on 01/29/2022. Electronically Signed   By: Ammie Ferrier M.D.   On: 01/30/2022 07:52   Result Date: 01/30/2022 CLINICAL DATA:  77 year old female presenting for ultrasound-guided biopsy of a right breast mass. EXAM: ULTRASOUND GUIDED RIGHT BREAST CORE NEEDLE BIOPSY COMPARISON:  Previous exam(s). PROCEDURE: I met with the patient and we discussed the procedure of ultrasound-guided biopsy, including benefits and alternatives. We discussed the high likelihood of a successful procedure. We discussed the risks of  the procedure, including infection, bleeding, tissue injury, clip migration, and inadequate sampling. Informed written consent was given. The usual time-out protocol was performed immediately prior to the procedure. Lesion quadrant: Upper outer quadrant Using sterile technique and 1% Lidocaine as local anesthetic, under direct ultrasound visualization, a 14 gauge spring-loaded device was used to perform biopsy of a mass in the right breast at 9:30, 6 cm from the nipple using a lateral approach. At the conclusion of the procedure ribbon shaped tissue marker clip was deployed into the biopsy cavity. Follow up 2 view mammogram was performed and dictated separately.  IMPRESSION: Ultrasound guided biopsy of a right breast mass at 9:30. No apparent complications. Electronically Signed: By: Ammie Ferrier M.D. On: 01/27/2022 13:36  MM CLIP PLACEMENT RIGHT  Result Date: 01/27/2022 CLINICAL DATA:  Post biopsy mammogram of the right breast for clip placement. EXAM: 3D DIAGNOSTIC RIGHT MAMMOGRAM POST ULTRASOUND BIOPSY COMPARISON:  Previous exam(s). FINDINGS: 3D Mammographic images were obtained following ultrasound guided biopsy of a mass in the right breast at 9:30. The biopsy marking clip is in expected position at the site of biopsy. IMPRESSION: Appropriate positioning of the ribbon shaped biopsy marking clip at the site of biopsy in the upper-outer right breast. Final Assessment: Post Procedure Mammograms for Marker Placement Electronically Signed   By: Ammie Ferrier M.D.   On: 01/27/2022 13:35  MM DIAG BREAST TOMO UNI RIGHT  Result Date: 01/20/2022 CLINICAL DATA:  Screening recall for possible right breast asymmetry. EXAM: DIGITAL DIAGNOSTIC UNILATERAL RIGHT MAMMOGRAM WITH TOMOSYNTHESIS; ULTRASOUND RIGHT BREAST LIMITED TECHNIQUE: Right digital diagnostic mammography and breast tomosynthesis was performed.; Targeted ultrasound examination of the right breast was performed COMPARISON:  Previous exam(s). ACR  Breast Density Category b: There are scattered areas of fibroglandular density. FINDINGS: Additional tomograms were performed of the right breast. There is a persistent asymmetry in the upper-outer right breast best visualized on the MLO and ML tomograms, however this is felt to spread out on the spot compression CC tomograms, possibly related to dense fibroglandular tissue. Targeted ultrasound of the upper-outer quadrant the right breast was performed. Multiple areas of shadowing with dense fibroglandular tissue are identified in the upper-outer quadrant of the right breast, however there is a reproducible ill-defined hypoechoic mass/area of shadowing in the right breast at the 9:30 position 6 cm from nipple measuring 0.5 x 0.3 x 0.5 cm. This may but not definitely correspond with the initially questioned asymmetry seen in the upper-outer right breast at mammography. No lymphadenopathy seen in the right axilla. IMPRESSION: Indeterminate 0.5 cm mass in the right breast at the 9:30 position. RECOMMENDATION: Recommend ultrasound-guided core biopsy of the mass in the right breast at the 9:30 position. If this mass is not reproducible at the time the patient returns for biopsy, then six-month follow-up diagnostic mammography of the right breast would be recommended. I have discussed the findings and recommendations with the patient. If applicable, a reminder letter will be sent to the patient regarding the next appointment. BI-RADS CATEGORY  4: Suspicious. Electronically Signed   By: Everlean Alstrom M.D.   On: 01/20/2022 10:43  US BREAST LTD UNI RIGHT INC AXILLA  Result Date: 01/20/2022 CLINICAL DATA:  Screening recall for possible right breast asymmetry. EXAM: DIGITAL DIAGNOSTIC UNILATERAL RIGHT MAMMOGRAM WITH TOMOSYNTHESIS; ULTRASOUND RIGHT BREAST LIMITED TECHNIQUE: Right digital diagnostic mammography and breast tomosynthesis was performed.; Targeted ultrasound examination of the right breast was performed  COMPARISON:  Previous exam(s). ACR Breast Density Category b: There are scattered areas of fibroglandular density. FINDINGS: Additional tomograms were performed of the right breast. There is a persistent asymmetry in the upper-outer right breast best visualized on the MLO and ML tomograms, however this is felt to spread out on the spot compression CC tomograms, possibly related to dense fibroglandular tissue. Targeted ultrasound of the upper-outer quadrant the right breast was performed. Multiple areas of shadowing with dense fibroglandular tissue are identified in the upper-outer quadrant of the right breast, however there is a reproducible ill-defined hypoechoic mass/area of shadowing in the right breast at the 9:30 position 6 cm from nipple measuring 0.5 x  0.3 x 0.5 cm. This may but not definitely correspond with the initially questioned asymmetry seen in the upper-outer right breast at mammography. No lymphadenopathy seen in the right axilla. IMPRESSION: Indeterminate 0.5 cm mass in the right breast at the 9:30 position. RECOMMENDATION: Recommend ultrasound-guided core biopsy of the mass in the right breast at the 9:30 position. If this mass is not reproducible at the time the patient returns for biopsy, then six-month follow-up diagnostic mammography of the right breast would be recommended. I have discussed the findings and recommendations with the patient. If applicable, a reminder letter will be sent to the patient regarding the next appointment. BI-RADS CATEGORY  4: Suspicious. Electronically Signed   By: Everlean Alstrom M.D.   On: 01/20/2022 10:43     IMPRESSION/PLAN: ***   It was a pleasure meeting the patient today. We discussed the risks, benefits, and side effects of radiotherapy. I recommend radiotherapy to the *** to reduce her risk of locoregional recurrence by 2/3.  We discussed that radiation would take approximately *** weeks to complete and that I would give the patient a few weeks to heal  following surgery before starting treatment planning. *** If chemotherapy were to be given, this would precede radiotherapy. We spoke about acute effects including skin irritation and fatigue as well as much less common late effects including internal organ injury or irritation. We spoke about the latest technology that is used to minimize the risk of late effects for patients undergoing radiotherapy to the breast or chest wall. No guarantees of treatment were given. The patient is enthusiastic about proceeding with treatment. I look forward to participating in the patient's care.  I will await her referral back to me for postoperative follow-up and eventual CT simulation/treatment planning.  On date of service, in total, I spent *** minutes on this encounter. Patient was seen in person.   __________________________________________   Eppie Gibson, MD  This document serves as a record of services personally performed by Eppie Gibson, MD. It was created on her behalf by Roney Mans, a trained medical scribe. The creation of this record is based on the scribe's personal observations and the provider's statements to them. This document has been checked and approved by the attending provider.

## 2022-02-17 ENCOUNTER — Ambulatory Visit
Admission: RE | Admit: 2022-02-17 | Discharge: 2022-02-17 | Disposition: A | Discharge status: Home / Self Care | Payer: Medicare Other | Source: Ambulatory Visit | Attending: Radiation Oncology | Admitting: Radiation Oncology

## 2022-02-17 ENCOUNTER — Other Ambulatory Visit: Payer: Self-pay

## 2022-02-17 ENCOUNTER — Encounter: Payer: Self-pay | Admitting: Radiation Oncology

## 2022-02-17 ENCOUNTER — Other Ambulatory Visit: Payer: Self-pay | Admitting: General Surgery

## 2022-02-17 VITALS — BP 127/78 | HR 79 | Temp 97.7°F | Resp 18 | Wt 165.4 lb

## 2022-02-17 DIAGNOSIS — C50411 Malignant neoplasm of upper-outer quadrant of right female breast: Secondary | ICD-10-CM | POA: Diagnosis not present

## 2022-02-17 DIAGNOSIS — Z87891 Personal history of nicotine dependence: Secondary | ICD-10-CM | POA: Diagnosis not present

## 2022-02-17 DIAGNOSIS — Z923 Personal history of irradiation: Secondary | ICD-10-CM | POA: Diagnosis not present

## 2022-02-17 DIAGNOSIS — Z170 Estrogen receptor positive status [ER+]: Secondary | ICD-10-CM | POA: Diagnosis not present

## 2022-02-17 DIAGNOSIS — Z79890 Hormone replacement therapy (postmenopausal): Secondary | ICD-10-CM | POA: Diagnosis not present

## 2022-02-17 DIAGNOSIS — Z79899 Other long term (current) drug therapy: Secondary | ICD-10-CM | POA: Insufficient documentation

## 2022-02-17 DIAGNOSIS — H40013 Open angle with borderline findings, low risk, bilateral: Secondary | ICD-10-CM | POA: Diagnosis not present

## 2022-02-19 ENCOUNTER — Encounter: Payer: Self-pay | Admitting: *Deleted

## 2022-02-19 NOTE — Progress Notes (Signed)
Patient is an established patient of ours, who after her last office visit was diagnosed with a new right breast cancer ER/PR+ HER2-. She has already been seen by radiation oncology and is scheduled for surgery on 03/02/2022.  Reached out to Franne Grip to introduce myself as the office RN Navigator and explain our new patient process. Reviewed the reason for their referral and scheduled their new patient appointment along with labs. Reviewed with patient any concerns they may have or any possible barriers to attending their appointment.   Informed patient about my role as a navigator and that I will meet with them prior to their New Patient appointment and more fully discuss what services I can provide. At this time patient has no further questions or needs.    Oncology Nurse Navigator Documentation     02/19/2022    9:30 AM  Oncology Nurse Navigator Flowsheets  Abnormal Finding Date 01/20/2022  Confirmed Diagnosis Date 01/27/2022  Diagnosis Status Confirmed Diagnosis Complete  Navigator Follow Up Date: 03/02/2022  Navigator Follow Up Reason: Surgery  Navigator Location [facility]  Referral Date to RadOnc/MedOnc 02/18/2022  Navigator Encounter Type Introductory Phone Call  Patient Visit Type MedOnc  Treatment Phase Pre-Tx/Tx Discussion  Barriers/Navigation Needs Coordination of Care;Education  Education Other  Interventions Coordination of Care;Education  Acuity Level 2-Minimal Needs (1-2 Barriers Identified)  Coordination of Care Appts  Education Method Verbal  Support Groups/Services Friends and Family  Time Spent with Patient 30  Genetic Counseling Type Non-Urgent  Genetic Counseling Date 04/09/2022

## 2022-02-23 ENCOUNTER — Encounter (HOSPITAL_BASED_OUTPATIENT_CLINIC_OR_DEPARTMENT_OTHER): Payer: Self-pay | Admitting: General Surgery

## 2022-02-27 ENCOUNTER — Ambulatory Visit
Admission: RE | Admit: 2022-02-27 | Discharge: 2022-02-27 | Disposition: A | Discharge status: Home / Self Care | Payer: Medicare Other | Source: Ambulatory Visit | Attending: General Surgery | Admitting: General Surgery

## 2022-02-27 DIAGNOSIS — C50911 Malignant neoplasm of unspecified site of right female breast: Secondary | ICD-10-CM | POA: Diagnosis not present

## 2022-02-27 DIAGNOSIS — Z170 Estrogen receptor positive status [ER+]: Secondary | ICD-10-CM

## 2022-02-27 HISTORY — PX: BREAST BIOPSY: SHX20

## 2022-03-02 ENCOUNTER — Other Ambulatory Visit: Payer: Self-pay

## 2022-03-02 ENCOUNTER — Ambulatory Visit (HOSPITAL_BASED_OUTPATIENT_CLINIC_OR_DEPARTMENT_OTHER)
Admission: RE | Admit: 2022-03-02 | Discharge: 2022-03-02 | Disposition: A | Discharge status: Home / Self Care | Payer: Medicare Other | Attending: General Surgery | Admitting: General Surgery

## 2022-03-02 ENCOUNTER — Encounter (HOSPITAL_BASED_OUTPATIENT_CLINIC_OR_DEPARTMENT_OTHER): Payer: Self-pay | Admitting: General Surgery

## 2022-03-02 ENCOUNTER — Encounter (HOSPITAL_BASED_OUTPATIENT_CLINIC_OR_DEPARTMENT_OTHER)
Admission: RE | Disposition: A | Discharge status: Home / Self Care | Payer: Self-pay | Source: Home / Self Care | Attending: General Surgery

## 2022-03-02 ENCOUNTER — Ambulatory Visit (HOSPITAL_BASED_OUTPATIENT_CLINIC_OR_DEPARTMENT_OTHER): Payer: Medicare Other | Admitting: Anesthesiology

## 2022-03-02 ENCOUNTER — Ambulatory Visit
Admission: RE | Admit: 2022-03-02 | Discharge: 2022-03-02 | Disposition: A | Discharge status: Home / Self Care | Payer: Medicare Other | Source: Ambulatory Visit | Attending: General Surgery | Admitting: General Surgery

## 2022-03-02 DIAGNOSIS — N6011 Diffuse cystic mastopathy of right breast: Secondary | ICD-10-CM | POA: Diagnosis not present

## 2022-03-02 DIAGNOSIS — C801 Malignant (primary) neoplasm, unspecified: Secondary | ICD-10-CM

## 2022-03-02 DIAGNOSIS — E039 Hypothyroidism, unspecified: Secondary | ICD-10-CM | POA: Diagnosis not present

## 2022-03-02 DIAGNOSIS — C50411 Malignant neoplasm of upper-outer quadrant of right female breast: Secondary | ICD-10-CM | POA: Insufficient documentation

## 2022-03-02 DIAGNOSIS — Z170 Estrogen receptor positive status [ER+]: Secondary | ICD-10-CM | POA: Diagnosis not present

## 2022-03-02 DIAGNOSIS — K219 Gastro-esophageal reflux disease without esophagitis: Secondary | ICD-10-CM | POA: Insufficient documentation

## 2022-03-02 DIAGNOSIS — C50911 Malignant neoplasm of unspecified site of right female breast: Secondary | ICD-10-CM | POA: Diagnosis not present

## 2022-03-02 DIAGNOSIS — Z87891 Personal history of nicotine dependence: Secondary | ICD-10-CM | POA: Diagnosis not present

## 2022-03-02 DIAGNOSIS — R928 Other abnormal and inconclusive findings on diagnostic imaging of breast: Secondary | ICD-10-CM | POA: Diagnosis not present

## 2022-03-02 DIAGNOSIS — Z01818 Encounter for other preprocedural examination: Secondary | ICD-10-CM

## 2022-03-02 HISTORY — PX: BREAST LUMPECTOMY WITH RADIOACTIVE SEED LOCALIZATION: SHX6424

## 2022-03-02 HISTORY — DX: Hypothyroidism, unspecified: E03.9

## 2022-03-02 HISTORY — DX: Malignant (primary) neoplasm, unspecified: C80.1

## 2022-03-02 HISTORY — DX: Gastro-esophageal reflux disease without esophagitis: K21.9

## 2022-03-02 SURGERY — BREAST LUMPECTOMY WITH RADIOACTIVE SEED LOCALIZATION
Anesthesia: General | Site: Breast | Laterality: Right

## 2022-03-02 MED ORDER — CEFAZOLIN SODIUM-DEXTROSE 2-4 GM/100ML-% IV SOLN
2.0000 g | INTRAVENOUS | Status: AC
  Administered 2022-03-02: 2 g via INTRAVENOUS

## 2022-03-02 MED ORDER — CHLORHEXIDINE GLUCONATE CLOTH 2 % EX PADS: 6.0000 | MEDICATED_PAD | Freq: Once | CUTANEOUS | Status: DC

## 2022-03-02 MED ORDER — OXYCODONE HCL 5 MG PO TABS: 5.0000 mg | ORAL_TABLET | Freq: Once | ORAL | Status: DC | PRN

## 2022-03-02 MED ORDER — FENTANYL CITRATE (PF) 100 MCG/2ML IJ SOLN
INTRAMUSCULAR | Status: DC | PRN
  Administered 2022-03-02: 50 ug via INTRAVENOUS
  Administered 2022-03-02: 25 ug via INTRAVENOUS

## 2022-03-02 MED ORDER — FENTANYL CITRATE (PF) 100 MCG/2ML IJ SOLN
INTRAMUSCULAR | Status: AC
  Filled 2022-03-02: qty 2

## 2022-03-02 MED ORDER — LIDOCAINE HCL (CARDIAC) PF 100 MG/5ML IV SOSY
PREFILLED_SYRINGE | INTRAVENOUS | Status: DC | PRN
  Administered 2022-03-02: 60 mg via INTRAVENOUS

## 2022-03-02 MED ORDER — PROPOFOL 500 MG/50ML IV EMUL
INTRAVENOUS | Status: DC | PRN
  Administered 2022-03-02: 150 ug/kg/min via INTRAVENOUS

## 2022-03-02 MED ORDER — DEXAMETHASONE SODIUM PHOSPHATE 10 MG/ML IJ SOLN
INTRAMUSCULAR | Status: DC | PRN
  Administered 2022-03-02: 5 mg via INTRAVENOUS

## 2022-03-02 MED ORDER — AMISULPRIDE (ANTIEMETIC) 5 MG/2ML IV SOLN: 10.0000 mg | Freq: Once | INTRAVENOUS | Status: DC | PRN

## 2022-03-02 MED ORDER — OXYCODONE HCL 5 MG/5ML PO SOLN: 5.0000 mg | Freq: Once | ORAL | Status: DC | PRN

## 2022-03-02 MED ORDER — ONDANSETRON HCL 4 MG/2ML IJ SOLN: 4.0000 mg | Freq: Once | INTRAMUSCULAR | Status: DC | PRN

## 2022-03-02 MED ORDER — PROPOFOL 500 MG/50ML IV EMUL: INTRAVENOUS | Status: DC | PRN

## 2022-03-02 MED ORDER — ACETAMINOPHEN 500 MG PO TABS
1000.0000 mg | ORAL_TABLET | Freq: Once | ORAL | Status: AC
  Administered 2022-03-02: 1000 mg via ORAL

## 2022-03-02 MED ORDER — ACETAMINOPHEN 500 MG PO TABS: 1000.0000 mg | ORAL_TABLET | ORAL | Status: DC

## 2022-03-02 MED ORDER — ENSURE PRE-SURGERY PO LIQD: 296.0000 mL | Freq: Once | ORAL | Status: DC

## 2022-03-02 MED ORDER — PROPOFOL 10 MG/ML IV BOLUS
INTRAVENOUS | Status: DC | PRN
  Administered 2022-03-02: 100 mg via INTRAVENOUS
  Administered 2022-03-02: 30 mg via INTRAVENOUS

## 2022-03-02 MED ORDER — ONDANSETRON HCL 4 MG/2ML IJ SOLN
INTRAMUSCULAR | Status: DC | PRN
  Administered 2022-03-02: 4 mg via INTRAVENOUS

## 2022-03-02 MED ORDER — BUPIVACAINE HCL (PF) 0.25 % IJ SOLN
INTRAMUSCULAR | Status: DC | PRN
  Administered 2022-03-02: 10 mL

## 2022-03-02 MED ORDER — CEFAZOLIN SODIUM-DEXTROSE 2-4 GM/100ML-% IV SOLN
INTRAVENOUS | Status: AC
  Filled 2022-03-02: qty 100

## 2022-03-02 MED ORDER — TRAMADOL HCL 50 MG PO TABS: 50.0000 mg | ORAL_TABLET | Freq: Four times a day (QID) | ORAL | 0 refills | Status: DC | PRN

## 2022-03-02 MED ORDER — HYDROMORPHONE HCL 1 MG/ML IJ SOLN: 0.2500 mg | INTRAMUSCULAR | Status: DC | PRN

## 2022-03-02 MED ORDER — ACETAMINOPHEN 500 MG PO TABS
ORAL_TABLET | ORAL | Status: AC
  Filled 2022-03-02: qty 2

## 2022-03-02 MED ORDER — LACTATED RINGERS IV SOLN: INTRAVENOUS | Status: DC

## 2022-03-02 SURGICAL SUPPLY — 55 items
ADH SKN CLS APL DERMABOND .7 (GAUZE/BANDAGES/DRESSINGS) ×1
APL PRP STRL LF DISP 70% ISPRP (MISCELLANEOUS) ×1
APPLIER CLIP 9.375 MED OPEN (MISCELLANEOUS) ×1
APR CLP MED 9.3 20 MLT OPN (MISCELLANEOUS) ×1
BINDER BREAST LRG (GAUZE/BANDAGES/DRESSINGS) IMPLANT
BINDER BREAST MEDIUM (GAUZE/BANDAGES/DRESSINGS) IMPLANT
BINDER BREAST XLRG (GAUZE/BANDAGES/DRESSINGS) IMPLANT
BINDER BREAST XXLRG (GAUZE/BANDAGES/DRESSINGS) IMPLANT
BLADE SURG 15 STRL LF DISP TIS (BLADE) ×1 IMPLANT
BLADE SURG 15 STRL SS (BLADE) ×1
CANISTER SUC SOCK COL 7IN (MISCELLANEOUS) IMPLANT
CANISTER SUCT 1200ML W/VALVE (MISCELLANEOUS) IMPLANT
CHLORAPREP W/TINT 26 (MISCELLANEOUS) ×1 IMPLANT
CLIP APPLIE 9.375 MED OPEN (MISCELLANEOUS) IMPLANT
CLIP TI WIDE RED SMALL 6 (CLIP) IMPLANT
COVER BACK TABLE 60X90IN (DRAPES) ×1 IMPLANT
COVER MAYO STAND STRL (DRAPES) ×1 IMPLANT
COVER PROBE CYLINDRICAL 5X96 (MISCELLANEOUS) ×1 IMPLANT
DERMABOND ADVANCED .7 DNX12 (GAUZE/BANDAGES/DRESSINGS) ×1 IMPLANT
DRAPE LAPAROSCOPIC ABDOMINAL (DRAPES) ×1 IMPLANT
DRAPE UTILITY XL STRL (DRAPES) ×1 IMPLANT
DRSG TEGADERM 4X4.75 (GAUZE/BANDAGES/DRESSINGS) IMPLANT
ELECT COATED BLADE 2.86 ST (ELECTRODE) ×1 IMPLANT
ELECT REM PT RETURN 9FT ADLT (ELECTROSURGICAL) ×1
ELECTRODE REM PT RTRN 9FT ADLT (ELECTROSURGICAL) ×1 IMPLANT
GAUZE SPONGE 4X4 12PLY STRL LF (GAUZE/BANDAGES/DRESSINGS) IMPLANT
GLOVE BIO SURGEON STRL SZ7 (GLOVE) ×2 IMPLANT
GLOVE BIOGEL PI IND STRL 7.5 (GLOVE) ×1 IMPLANT
GOWN STRL REUS W/ TWL LRG LVL3 (GOWN DISPOSABLE) ×2 IMPLANT
GOWN STRL REUS W/TWL LRG LVL3 (GOWN DISPOSABLE) ×2
HEMOSTAT ARISTA ABSORB 3G PWDR (HEMOSTASIS) IMPLANT
KIT MARKER MARGIN INK (KITS) ×1 IMPLANT
NDL HYPO 25X1 1.5 SAFETY (NEEDLE) ×1 IMPLANT
NEEDLE HYPO 25X1 1.5 SAFETY (NEEDLE) ×1 IMPLANT
NS IRRIG 1000ML POUR BTL (IV SOLUTION) IMPLANT
PACK BASIN DAY SURGERY FS (CUSTOM PROCEDURE TRAY) ×1 IMPLANT
PENCIL SMOKE EVACUATOR (MISCELLANEOUS) ×1 IMPLANT
RETRACTOR ONETRAX LX 90X20 (MISCELLANEOUS) IMPLANT
SLEEVE SCD COMPRESS KNEE MED (STOCKING) ×1 IMPLANT
SPIKE FLUID TRANSFER (MISCELLANEOUS) IMPLANT
SPONGE T-LAP 4X18 ~~LOC~~+RFID (SPONGE) ×1 IMPLANT
STRIP CLOSURE SKIN 1/2X4 (GAUZE/BANDAGES/DRESSINGS) ×1 IMPLANT
SUT MNCRL AB 4-0 PS2 18 (SUTURE) ×1 IMPLANT
SUT MON AB 5-0 PS2 18 (SUTURE) IMPLANT
SUT SILK 2 0 SH (SUTURE) IMPLANT
SUT VIC AB 2-0 SH 27 (SUTURE) ×1
SUT VIC AB 2-0 SH 27XBRD (SUTURE) ×1 IMPLANT
SUT VIC AB 3-0 SH 27 (SUTURE) ×1
SUT VIC AB 3-0 SH 27X BRD (SUTURE) ×1 IMPLANT
SUT VIC AB 5-0 PS2 18 (SUTURE) IMPLANT
SYR CONTROL 10ML LL (SYRINGE) ×1 IMPLANT
TOWEL GREEN STERILE FF (TOWEL DISPOSABLE) ×1 IMPLANT
TRAY FAXITRON CT DISP (TRAY / TRAY PROCEDURE) ×1 IMPLANT
TUBE CONNECTING 20X1/4 (TUBING) IMPLANT
YANKAUER SUCT BULB TIP NO VENT (SUCTIONS) IMPLANT

## 2022-03-02 NOTE — H&P (Signed)
33 yof with prior history of left breast cancer in early 2000s. Treated with lumpectomy/sn biopsy then with XRT/tam for five years. She has done well from this. 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 She was just released by Dr Marin Olp. She had a screening mm that shows b density breast tissue. This showed a ruoq asymmetry. US showed a 5x3x5 mm mass. Axilla was negative. 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 She is here to discuss options.  Review of Systems: A complete review of systems was obtained from the patient. I have reviewed this information and discussed as appropriate with the patient. See HPI as well for other ROS.  Review of Systems  Neurological: Positive for weakness.  All other systems reviewed and are negative.   Medical History: Past Medical History:  Diagnosis Date  Arthritis  GERD (gastroesophageal reflux disease)  Glaucoma (increased eye pressure)  History of cancer  Thyroid disease   There is no problem list on file for this patient.  Past Surgical History:  Procedure Laterality Date  HYSTERECTOMY  MASTECTOMY    No Known Allergies  Current Outpatient Medications on File Prior to Visit  Medication Sig Dispense Refill  calcium carbonate (TUMS) 200 mg calcium (500 mg) chewable tablet Take by mouth  cyanocobalamin, vitamin B-12, (VITAMIN B-12) 2,500 mcg Subl Take by mouth  ezetimibe (ZETIA) 10 mg tablet Take 10 mg by mouth every evening  levothyroxine (SYNTHROID) 112 MCG tablet  methotrexate (RHEUMATREX) 2.5 MG tablet  pantoprazole (PROTONIX) 40 MG DR tablet Take by mouth  timoloL maleate (TIMOPTIC) 0.5 % ophthalmic solution   No current facility-administered medications on file prior to visit.   Family History  Problem Relation Age of Onset  High blood pressure (Hypertension) Mother  High blood pressure (Hypertension) Father  Obesity  Sister  Hyperlipidemia (Elevated cholesterol) Sister  Breast cancer Sister  High blood pressure (Hypertension) Sister  Obesity Brother  High blood pressure (Hypertension) Brother    Social History   Tobacco Use  Smoking Status Former  Types: Cigarettes  Quit date: 2007  Years since quitting: 17.0  Smokeless Tobacco Not on file  Marital status: Married  Tobacco Use  Smoking status: Former  Types: Cigarettes  Quit date: 2007  Years since quitting: 17.0  Substance and Sexual Activity  Alcohol use: Yes  Drug use: Not Currently   Objective:   Vitals:  02/13/22 1035  BP: 122/72  Pulse: 89  Weight: 73.9 kg (163 lb)  Height: 154.9 cm (5' 1"$ )   Body mass index is 30.8 kg/m.  Physical Exam Vitals reviewed.  Constitutional:  Appearance: Normal appearance.  Chest:  Breasts: Right: No inverted nipple, mass or nipple discharge.  Left: No inverted nipple, mass or nipple discharge.  Comments: Healed left breast and axillary incisions Lymphadenopathy:  Upper Body:  Right upper body: No supraclavicular or axillary adenopathy.  Left upper body: No supraclavicular or axillary adenopathy.  Neurological:  Mental Status: She is alert.    Assessment and Plan:   Malignant neoplasm of upper-outer quadrant of right breast in female, estrogen receptor positive   Right breast seed guided lumpectomy  We discussed the staging and pathophysiology of breast cancer. We discussed all of the different options for treatment for breast cancer including surgery, chemotherapy, radiation therapy, Herceptin, and antiestrogen therapy. Will refer her to see genetics at  cancer center at some point- this will not alter her surgery.  We discussed a sentinel lymph node biopsy but based on choosing wisely guidelines and now SOUND trial data I think we can omit a sn biopsy for her given the size, biology and normal ax u/s. She was agreeable to that.  We discussed the options for treatment of the  breast cancer which included lumpectomy versus a mastectomy. We discussed the performance of the lumpectomy with radioactive seed placement. We discussed a 5-10% chance of a positive margin requiring reexcision in the operating room. We also discussed that she would have a discussion about radiation therapy if she undergoes lumpectomy. I will refer her to see Dr Isidore Moos. We discussed mastectomy and the postoperative care for that as well. Mastectomy can be followed by reconstruction. The decision for lumpectomy vs mastectomy has no impact on decision for chemotherapy. Most mastectomy patients will not need radiation therapy. We discussed that there is no difference in her survival whether she undergoes lumpectomy with radiation therapy or antiestrogen therapy versus a mastectomy. There is also no real difference between her recurrence in the breast. We discussed possible oncotype pending final size of tumor as well  Will work to schedule her soon  We discussed the risks of operation including bleeding, infection, possible reoperation. She understands her further therapy will be based on what her stages at the time of her operation.

## 2022-03-02 NOTE — Transfer of Care (Signed)
Immediate Anesthesia Transfer of Care Note  Patient: Lauren [name]  Procedure(s) Performed: RIGHT BREAST LUMPECTOMY WITH RADIOACTIVE SEED LOCALIZATION (Right: Breast)  Patient Location: PACU  Anesthesia Type:General  Level of Consciousness: drowsy  Airway & Oxygen Therapy: Patient Spontanous Breathing and Patient connected to face mask oxygen  Post-op Assessment: Report given to RN and Post -op Vital signs reviewed and stable  Post vital signs: Reviewed and stable  Last Vitals:  Vitals Value Taken Time  BP 131/83 03/02/22 1442  Temp    Pulse 75 03/02/22 1444  Resp 25 03/02/22 1444  SpO2 100 % 03/02/22 1444  Vitals shown include unvalidated device data.  Last Pain:  Vitals:   03/02/22 1221  TempSrc: Oral  PainSc: 0-No pain      Patients Stated Pain Goal: 3 ([id number])  Complications: No notable events documented.

## 2022-03-02 NOTE — Interval H&P Note (Signed)
History and Physical Interval Note:  03/02/2022 1:27 PM  Lauren [name]  has presented today for surgery, with the diagnosis of RIGHT BREAST CANCER.  The various methods of treatment have been discussed with the patient and family. After consideration of risks, benefits and other options for treatment, the patient has consented to  Procedure(s): RIGHT BREAST LUMPECTOMY WITH RADIOACTIVE SEED LOCALIZATION (Right) as a surgical intervention.  The patient's history has been reviewed, patient examined, no change in status, stable for surgery.  I have reviewed the patient's chart and labs.  Questions were answered to the patient's satisfaction.     Rolm Bookbinder

## 2022-03-02 NOTE — Anesthesia Procedure Notes (Signed)
Procedure Name: LMA Insertion Date/Time: 03/02/2022 1:45 PM  Performed by: Lavonia Dana, CRNAPre-anesthesia Checklist: Patient identified, Emergency Drugs available, Suction available and Patient being monitored Patient Re-evaluated:Patient Re-evaluated prior to induction Oxygen Delivery Method: Circle system utilized Preoxygenation: Pre-oxygenation with 100% oxygen Induction Type: IV induction Ventilation: Mask ventilation without difficulty LMA: LMA inserted LMA Size: 4.0 Number of attempts: 1 Airway Equipment and Method: Bite block Placement Confirmation: positive ETCO2 Tube secured with: Tape Dental Injury: Teeth and Oropharynx as per pre-operative assessment

## 2022-03-02 NOTE — Anesthesia Preprocedure Evaluation (Addendum)
Anesthesia Evaluation  Patient identified by MRN, date of birth, ID band Patient awake    Reviewed: Allergy & Precautions, NPO status , Patient's Chart, lab work & pertinent test results  Airway Mallampati: II  TM Distance: >3 FB Neck ROM: Full    Dental no notable dental hx.    Pulmonary former smoker   Pulmonary exam normal breath sounds clear to auscultation       Cardiovascular negative cardio ROS Normal cardiovascular exam Rhythm:Regular Rate:Normal     Neuro/Psych negative neurological ROS  negative psych ROS   GI/Hepatic Neg liver ROS,GERD  Medicated and Controlled,,  Endo/Other  Hypothyroidism    Renal/GU negative Renal ROS  negative genitourinary   Musculoskeletal  (+) Arthritis , Osteoarthritis,    Abdominal   Peds  Hematology negative hematology ROS (+)   Anesthesia Other Findings R breast ca   Reproductive/Obstetrics negative OB ROS                             Anesthesia Physical Anesthesia Plan  ASA: 2  Anesthesia Plan: General   Post-op Pain Management: Tylenol PO (pre-op)*   Induction: Intravenous  PONV Risk Score and Plan: 3 and Ondansetron, Dexamethasone, Midazolam and Treatment may vary due to age or medical condition  Airway Management Planned: LMA  Additional Equipment: None  Intra-op Plan:   Post-operative Plan: Extubation in OR  Informed Consent: I have reviewed the patients History and Physical, chart, labs and discussed the procedure including the risks, benefits and alternatives for the proposed anesthesia with the patient or authorized representative who has indicated his/her understanding and acceptance.     Dental advisory given  Plan Discussed with: CRNA  Anesthesia Plan Comments:        Anesthesia Quick Evaluation

## 2022-03-02 NOTE — Op Note (Signed)
Preoperative diagnosis: Right breast cancer Postoperative diagnosis: Same as above Procedure:  Right breast radioactive seed guided lumpectomy Surgeon: Dr. Serita Grammes Anesthesia: General  Estimated blood loss: Less than 20 cc Specimens: 1.  Right breast tissue containing seed and clip 3. Additional right breast superior, inferior and medial margin marked short superior, long lateral and double deep Sponge and count was correct completion Disposition to recovery stable condition   Indications:76 yof with prior history of left breast cancer in early 2000s. Treated with lumpectomy/sn biopsy then with XRT/tam for five years.She had a screening mm that shows b density breast tissue. This showed a ruoq asymmetry. US showed a 5x3x5 mm mass. Axilla was negative. 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 We discussed proceeding with lumpectomy.    Procedure: After informed consent was obtained she was taken to the OR.  SCDs were placed.  Antibiotics were given.  She was then placed under general anesthesia without complication.  She was prepped and draped in the standard sterile surgical fashion.  Surgical timeout was then performed.    The seed was in the upper outer quadrant. I made a curvilinear incision in the uoq as the seed was not far from the skin.   I then removed the seed in the surrounding tissue with an attempt to get a clear margin.  Mammogram confirmed removal of the seed and the clip. 3D imaging showed that I might be close to several margins and I removed these as above.  The posterior margin is the muscle.   I then obtained hemostasis.  I placed a couple clips in this cavity.  I closed the tissue down with 2-0 Vicryl and the skin was closed with 3-0 Vicryl and 4-0 Monocryl. Glue and Steri-Strips were eventually placed.    She tolerated this well was extubated transferred recovery stable.

## 2022-03-02 NOTE — Discharge Instructions (Addendum)
[place] Office Phone Number [phone]  POST OP INSTRUCTIONS Take 400 mg of ibuprofen every 8 hours or 650 mg tylenol every 6 hours for next 72 hours then as needed. Use ice several times daily also.  A prescription for pain medication may be given to you upon discharge.  Take your pain medication as prescribed, if needed.  If narcotic pain medicine is not needed, then you may take acetaminophen (Tylenol), naprosyn (Alleve) or ibuprofen (Advil) as needed. Take your usually prescribed medications unless otherwise directed If you need a refill on your pain medication, please contact your pharmacy.  They will contact our office to request authorization.  Prescriptions will not be filled after 5pm or on week-ends. You should eat very light the first 24 hours after surgery, such as soup, crackers, pudding, etc.  Resume your normal diet the day after surgery. Most patients will experience some swelling and bruising in the breast.  Ice packs and a good support bra will help.  Wear the breast binder provided or a sports bra for 72 hours day and night.  After that wear a sports bra during the day until you return to the office. Swelling and bruising can take several days to resolve.  It is common to experience some constipation if taking pain medication after surgery.  Increasing fluid intake and taking a stool softener will usually help or prevent this problem from occurring.  A mild laxative (Milk of Magnesia or Miralax) should be taken according to package directions if there are no bowel movements after 48 hours. I used skin glue on the incision, you may shower in 24 hours.  The glue will flake off over the next 2-3 weeks.  Any sutures or staples will be removed at the office during your follow-up visit. ACTIVITIES:  You may resume regular daily activities (gradually increasing) beginning the next day.  Wearing a good support bra or sports bra minimizes pain and swelling.  You may have  sexual intercourse when it is comfortable. You may drive when you no longer are taking prescription pain medication, you can comfortably wear a seatbelt, and you can safely maneuver your car and apply brakes. RETURN TO WORK:  ______________________________________________________________________________________ Lauren [name] should see your doctor in the office for a follow-up appointment approximately two weeks after your surgery.  Your doctor's nurse will typically make your follow-up appointment when she calls you with your pathology report.  Expect your pathology report 3-4 business days after your surgery.  You may call to check if you do not hear from [place] after three days. OTHER INSTRUCTIONS: _______________________________________________________________________________________________ _____________________________________________________________________________________________________________________________________ _____________________________________________________________________________________________________________________________________ _____________________________________________________________________________________________________________________________________  WHEN TO CALL DR [place]KEFIELD: Fever over 101.0 Nausea and/or vomiting. Extreme swelling or bruising. Continued bleeding from incision. Increased pain, redness, or drainage from the incision.  The clinic staff is available to answer your questions during regular business hours.  Please don't hesitate to call and ask to speak to one of the nurses for clinical concerns.  If you have a medical emergency, go to the nearest emergency room or call 911.  A surgeon from [facility] is always on call at the hospital.  For further questions, please visit [email or web address]   Post Anesthesia Home Care Instructions  Activity: Get plenty of rest for the remainder of the day. A responsible individual must stay  with you for 24 hours following the procedure.  For the next 24 hours, DO NOT: -Drive a car -Paediatric nurse -Drink alcoholic beverages -Take any medication unless instructed by  your physician -Make any legal decisions or sign important papers.  Meals: Start with liquid foods such as gelatin or soup. Progress to regular foods as tolerated. Avoid greasy, spicy, heavy foods. If nausea and/or vomiting occur, drink only clear liquids until the nausea and/or vomiting subsides. Call your physician if vomiting continues.  Special Instructions/Symptoms: Your throat may feel dry or sore from the anesthesia or the breathing tube placed in your throat during surgery. If this causes discomfort, gargle with warm salt water. The discomfort should disappear within 24 hours.  If you had a scopolamine patch placed behind your ear for the management of post- operative nausea and/or vomiting:  1. The medication in the patch is effective for 72 hours, after which it should be removed.  Wrap patch in a tissue and discard in the trash. Wash hands thoroughly with soap and water. 2. You may remove the patch earlier than 72 hours if you experience unpleasant side effects which may include dry mouth, dizziness or visual disturbances. 3. Avoid touching the patch. Wash your hands with soap and water after contact with the patch.    No tylenol today until after 6:30 today if needed.

## 2022-03-03 ENCOUNTER — Encounter: Payer: Self-pay | Admitting: *Deleted

## 2022-03-03 ENCOUNTER — Encounter (HOSPITAL_BASED_OUTPATIENT_CLINIC_OR_DEPARTMENT_OTHER): Payer: Self-pay | Admitting: General Surgery

## 2022-03-03 NOTE — Progress Notes (Signed)
Patient had Right breast radioactive seed guided lumpectomy on 03/02/2022. Will follow for path.  Oncology Nurse Navigator Documentation     03/03/2022    9:00 AM  Oncology Nurse Navigator Flowsheets  Phase of Treatment Surgery  Surgery Actual Start Date: 03/02/2022  Navigator Follow Up Date: 03/05/2022  Navigator Follow Up Reason: Pathology  Navigator Location [facility]  Navigator Encounter Type Appt/Treatment Plan Review  Patient Visit Type MedOnc  Treatment Phase Active Tx  Barriers/Navigation Needs Coordination of Care;Education  Interventions None Required  Acuity Level 2-Minimal Needs (1-2 Barriers Identified)  Support Groups/Services Friends and Family  Time Spent with Patient 15

## 2022-03-04 LAB — SURGICAL PATHOLOGY

## 2022-03-04 NOTE — Anesthesia Postprocedure Evaluation (Signed)
Anesthesia Post Note  Patient: Lauren [name]  Procedure(s) Performed: RIGHT BREAST LUMPECTOMY WITH RADIOACTIVE SEED LOCALIZATION (Right: Breast)     Patient location during evaluation: Phase II Anesthesia Type: General Level of consciousness: awake Pain management: pain level controlled Vital Signs Assessment: post-procedure vital signs reviewed and stable Respiratory status: spontaneous breathing Cardiovascular status: stable Postop Assessment: no apparent nausea or vomiting Anesthetic complications: no  No notable events documented.  Last Vitals:  Vitals:   03/02/22 1501 03/02/22 1545  BP: 135/79 (!) 138/91  Pulse: 72 72  Resp: 16 16  Temp:  36.5 C  SpO2: 95% 98%    Last Pain:  Vitals:   03/03/22 0840  TempSrc:   PainSc: 0-No pain                 Huston Foley

## 2022-03-05 ENCOUNTER — Encounter: Payer: Self-pay | Admitting: *Deleted

## 2022-03-05 ENCOUNTER — Encounter (HOSPITAL_COMMUNITY): Payer: Self-pay

## 2022-03-05 NOTE — Progress Notes (Signed)
Per Dr Marin Olp, request for Oncotype DX Recurrence Score sent on specimen [id number] DOS 03/02/2022.  Oncology Nurse Navigator Documentation     03/05/2022    7:30 AM  Oncology Nurse Navigator Flowsheets  Navigator Follow Up Date: 03/18/2022  Navigator Follow Up Reason: New Patient Appointment  Navigator Location [facility]  Navigator Encounter Type Pathology Review  Patient Visit Type MedOnc  Treatment Phase Active Tx  Barriers/Navigation Needs Coordination of Care;Education  Interventions Coordination of Care  Acuity Level 2-Minimal Needs (1-2 Barriers Identified)  Coordination of Care Pathology  Support Groups/Services Friends and Family  Time Spent with Patient 30

## 2022-03-09 ENCOUNTER — Ambulatory Visit: Payer: Self-pay | Admitting: Student

## 2022-03-09 NOTE — Progress Notes (Signed)
Sent message, via epic in basket, requesting orders in epic from surgeon.  

## 2022-03-12 ENCOUNTER — Encounter: Payer: Self-pay | Admitting: *Deleted

## 2022-03-12 ENCOUNTER — Encounter: Payer: Self-pay | Admitting: Radiology

## 2022-03-12 ENCOUNTER — Encounter (HOSPITAL_COMMUNITY): Payer: Self-pay

## 2022-03-12 DIAGNOSIS — C50912 Malignant neoplasm of unspecified site of left female breast: Secondary | ICD-10-CM | POA: Diagnosis not present

## 2022-03-12 DIAGNOSIS — Z170 Estrogen receptor positive status [ER+]: Secondary | ICD-10-CM | POA: Diagnosis not present

## 2022-03-12 NOTE — Progress Notes (Signed)
Oncotype Recurrence Score 9 Report given to Dr Marin Olp and copy placed in scan bin.  Oncology Nurse Navigator Documentation     03/12/2022   10:15 AM  Oncology Nurse Navigator Flowsheets  Navigator Follow Up Date: 03/18/2022  Navigator Follow Up Reason: New Patient Appointment  Navigator Location [facility]  Navigator Encounter Type Pathology Review  Patient Visit Type MedOnc  Treatment Phase Active Tx  Barriers/Navigation Needs Coordination of Care;Education  Interventions Coordination of Care  Acuity Level 2-Minimal Needs (1-2 Barriers Identified)  Coordination of Care Pathology  Support Groups/Services Friends and Family  Time Spent with Patient 15

## 2022-03-14 NOTE — Progress Notes (Addendum)
COVID Vaccine received:  '[]'$  No '[x]'$  Yes Date of any COVID positive Test in last 90 days:  PCP - Asencion Noble, MD  clearance on chart Cardiologist -  Oncology- Burney Gauze, MD Rheumatology- Leigh Aurora, MD  Chest x-ray - 01-09-2019 2v  epic EKG - 06-23-2021  epic  Stress Test -  ECHO -  Cardiac Cath -   PCR screen: '[x]'$  Ordered & Completed                      '[]'$   No Order but Needs PROFEND                      '[]'$   N/A for this surgery  Surgery Plan:  '[x]'$  Ambulatory                            '[]'$  Outpatient in bed                            '[]'$  Admit  Anesthesia:    '[]'$  General  '[x]'$  Spinal                           '[]'$   Choice '[]'$   MAC  Pacemaker / ICD device '[x]'$  No '[]'$  Yes        Device order form faxed '[x]'$  No    '[]'$   Yes      Faxed to:  Spinal Cord Stimulator:'[x]'$  No '[]'$  Yes      (Remind patient to bring remote DOS) Other Implants:   History of Sleep Apnea? '[x]'$  No '[]'$  Yes   CPAP used?- '[x]'$  No '[]'$  Yes    Does the patient monitor blood sugar? '[]'$  No '[]'$  Yes  '[x]'$  N/A  Patient has: '[]'$  Pre-DM   '[]'$  DM1  '[]'$   DM2 Does patient have a [employer] or Dexacom? '[]'$  No '[]'$  Yes   Fasting Blood Sugar Ranges-  Checks Blood Sugar _____ times a day  Blood Thinner / Instructions: none Aspirin Instructions: none  ERAS Protocol Ordered: '[]'$  No  '[x]'$  Yes PRE-SURGERY '[x]'$  ENSURE  '[]'$  G2  Patient is to be NPO after: 05:30 am   Comments:   Activity level: Patient is able / unable to climb a flight of stairs without difficulty; '[]'$  No CP  '[]'$  No SOB, but would have ___   Patient can / can not perform ADLs without assistance.   Anesthesia review: recent right breast lumpectomy/ seed implant for cancer, GERD, Glaucoma  Patient denies shortness of breath, fever, cough and chest pain at PAT appointment.  Patient verbalized understanding and agreement to the Pre-Surgical Instructions that were given to them at this PAT appointment. Patient was also educated of the need to review these PAT instructions again  prior to his/her surgery.I reviewed the appropriate phone numbers to call if they have any and questions or concerns.

## 2022-03-14 NOTE — Patient Instructions (Signed)
SURGICAL WAITING ROOM VISITATION Patients having surgery or a procedure may have no more than 2 support people in the waiting area - these visitors may rotate in the visitor waiting room.   Due to an increase in RSV and influenza rates and associated hospitalizations, children ages 76 and under may not visit patients in [place]. If the patient needs to stay at the hospital during part of their recovery, the visitor guidelines for inpatient rooms apply.  PRE-OP VISITATION  Pre-op nurse will coordinate an appropriate time for 1 support person to accompany the patient in pre-op.  This support person may not rotate.  This visitor will be contacted when the time is appropriate for the visitor to come back in the pre-op area.  Please refer to the [facility] website for the visitor guidelines for Inpatients (after your surgery is over and you are in a regular room).  You are not required to quarantine at this time prior to your surgery. However, you must do this: Hand Hygiene often Do NOT share personal items Notify your provider if you are in close contact with someone who has COVID or you develop fever 100.4 or greater, new onset of sneezing, cough, sore throat, shortness of breath or body aches.  If you test positive for Covid or have been in contact with anyone that has tested positive in the last 10 days please notify you surgeon.    Your procedure is scheduled on:  Wednesday  March 25, 2022  Report to [facility] Main Entrance: [address] entrance where the [employer] is available.   Report to admitting at: 06:00    AM  +++++Call this number if you have any questions or problems the morning of surgery [phone]  Do not eat food after Midnight the night prior to your surgery/procedure.  After Midnight you may have the following liquids until 05:30 AM DAY OF SURGERY  Clear Liquid Diet Water Black Coffee (sugar ok, NO MILK/CREAM OR CREAMERS)  Tea (sugar ok, NO  MILK/CREAM OR CREAMERS) regular and decaf                             Plain Jell-O  with no fruit (NO RED)                                           Fruit ices (not with fruit pulp, NO RED)                                     Popsicles (NO RED)                                                                  Juice: apple, WHITE grape, WHITE cranberry Sports drinks like Gatorade or Powerade (NO RED)                    The day of surgery:  Drink ONE (1) Pre-Surgery Clear Ensure at  05:30  AM the morning of surgery. Drink in one sitting.  Do not sip.  This drink was given to you during your hospital pre-op appointment visit. Nothing else to drink after completing the Pre-Surgery Clear Ensure  : No candy, chewing gum or throat lozenges.    FOLLOW BOWEL PREP AND ANY ADDITIONAL PRE OP INSTRUCTIONS YOU RECEIVED FROM YOUR SURGEON'S OFFICE!!!   Oral Hygiene is also important to reduce your risk of infection.        Remember - BRUSH YOUR TEETH THE MORNING OF SURGERY WITH YOUR REGULAR TOOTHPASTE  Do NOT smoke after Midnight the night before surgery.  Take ONLY these medicines the morning of surgery with A SIP OF WATER: Synthroid, and you may use your Timolol eye drops.    If You have been diagnosed with Sleep Apnea - Bring CPAP mask and tubing day of surgery. We will provide you with a CPAP machine on the day of your surgery.                   You may not have any metal on your body including hair pins, jewelry, and body piercing  Do not wear make-up, lotions, powders, perfumes  or deodorant  Do not wear nail polish including gel and S&S, artificial / acrylic nails, or any other type of covering on natural nails including finger and toenails. If you have artificial nails, gel coating, etc., that needs to be removed by a nail salon, Please have this removed prior to surgery. Not doing so may mean that your surgery could be cancelled or delayed if the Surgeon or anesthesia staff feels like they  are unable to monitor you safely.   Do not shave 48 hours prior to surgery to avoid nicks in your skin which may contribute to postoperative infections.   Contacts, Hearing Aids, dentures or bridgework may not be worn into surgery. DENTURES WILL BE REMOVED PRIOR TO SURGERY PLEASE DO NOT APPLY "Poly grip" OR ADHESIVES!!!  Patients discharged on the day of surgery will not be allowed to drive home.  Someone NEEDS to stay with you for the first 24 hours after anesthesia.  Do not bring your home medications to the hospital. The Pharmacy will dispense medications listed on your medication list to you during your admission in the Hospital.  Special Instructions: Bring a copy of your healthcare power of attorney and living will documents the day of surgery, if you wish to have them scanned into your [place] [facility] Records- EPIC  Please read over the following fact sheets you were given: IF YOU HAVE QUESTIONS ABOUT YOUR [place], [place] [phone].   [place] - Preparing for Surgery Before surgery, you can play an important role.  Because skin is not sterile, your skin needs to be as free of germs as possible.  You can reduce the number of germs on your skin by washing with CHG (chlorahexidine gluconate) soap before surgery.  CHG is an antiseptic cleaner which kills germs and bonds with the skin to continue killing germs even after washing. Please DO NOT use if you have an allergy to CHG or antibacterial soaps.  If your skin becomes reddened/irritated stop using the CHG and inform your nurse when you arrive at Short Stay. Do not shave (including legs and underarms) for at least 48 hours prior to the first CHG shower.  You may shave your face/neck.  Please follow these instructions carefully:  1.  Shower with CHG Soap the night before surgery and the  morning of surgery.  2.  If  you choose to wash your hair, wash your hair first as usual with your normal  shampoo.  3.  After  you shampoo, rinse your hair and body thoroughly to remove the shampoo.                             4.  Use CHG as you would any other liquid soap.  You can apply chg directly to the skin and wash.  Gently with a scrungie or clean washcloth.  5.  Apply the CHG Soap to your body ONLY FROM THE NECK DOWN.   Do not use on face/ open                           Wound or open sores. Avoid contact with eyes, ears mouth and genitals (private parts).                       Wash face,  Genitals (private parts) with your normal soap.             6.  Wash thoroughly, paying special attention to the area where your  surgery  will be performed.  7.  Thoroughly rinse your body with warm water from the neck down.  8.  DO NOT shower/wash with your normal soap after using and rinsing off the CHG Soap.            9.  Pat yourself dry with a clean towel.            10.  Wear clean pajamas.            11.  Place clean sheets on your bed the night of your first shower and do not  sleep with pets.  ON THE DAY OF SURGERY : Do not apply any lotions/deodorants the morning of surgery.  Please wear clean clothes to the hospital/surgery center.    FAILURE TO FOLLOW THESE INSTRUCTIONS MAY RESULT IN THE CANCELLATION OF YOUR SURGERY  PATIENT SIGNATURE_________________________________  NURSE SIGNATURE__________________________________  ________________________________________________________________________     Lauren [name]    An incentive spirometer is a tool that can help keep your lungs clear and active. This tool measures how well you are filling your lungs with each breath. Taking long deep breaths may help reverse or decrease the chance of developing breathing (pulmonary) problems (especially infection) following: A long period of time when you are unable to move or be active. BEFORE THE PROCEDURE  If the spirometer includes an indicator to show your best effort, your nurse or respiratory therapist will  set it to a desired goal. If possible, sit up straight or lean slightly forward. Try not to slouch. Hold the incentive spirometer in an upright position. INSTRUCTIONS FOR USE  Sit on the edge of your bed if possible, or sit up as far as you can in bed or on a chair. Hold the incentive spirometer in an upright position. Breathe out normally. Place the mouthpiece in your mouth and seal your lips tightly around it. Breathe in slowly and as deeply as possible, raising the piston or the ball toward the top of the column. Hold your breath for 3-5 seconds or for as long as possible. Allow the piston or ball to fall to the bottom of the column. Remove the mouthpiece from your mouth and breathe out normally. Rest for a few seconds and repeat Steps 1  through 7 at least 10 times every 1-2 hours when you are awake. Take your time and take a few normal breaths between deep breaths. The spirometer may include an indicator to show your best effort. Use the indicator as a goal to work toward during each repetition. After each set of 10 deep breaths, practice coughing to be sure your lungs are clear. If you have an incision (the cut made at the time of surgery), support your incision when coughing by placing a pillow or rolled up towels firmly against it. Once you are able to get out of bed, walk around indoors and cough well. You may stop using the incentive spirometer when instructed by your caregiver.  RISKS AND COMPLICATIONS Take your time so you do not get dizzy or light-headed. If you are in pain, you may need to take or ask for pain medication before doing incentive spirometry. It is harder to take a deep breath if you are having pain. AFTER USE Rest and breathe slowly and easily. It can be helpful to keep track of a log of your progress. Your caregiver can provide you with a simple table to help with this. If you are using the spirometer at home, follow these instructions: [place] IF:  You  are having difficultly using the spirometer. You have trouble using the spirometer as often as instructed. Your pain medication is not giving enough relief while using the spirometer. You develop fever of 100.5 F (38.1 C) or higher.                                                                                                    SEEK IMMEDIATE MEDICAL CARE IF:  You cough up bloody sputum that had not been present before. You develop fever of 102 F (38.9 C) or greater. You develop worsening pain at or near the incision site. MAKE SURE YOU:  Understand these instructions. Will watch your condition. Will get help right away if you are not doing well or get worse. Document Released: 05/11/2006 Document Revised: 03/23/2011 Document Reviewed: 07/12/2006 [facility] Patient Information 2014 [place], [place].     WHAT IS A BLOOD TRANSFUSION? Blood Transfusion Information  A transfusion is the replacement of blood or some of its parts. Blood is made up of multiple cells which provide different functions. Red blood cells carry oxygen and are used for blood loss replacement. White blood cells fight against infection. Platelets control bleeding. Plasma helps clot blood. Other blood products are available for specialized needs, such as hemophilia or other clotting disorders. BEFORE THE TRANSFUSION  Who gives blood for transfusions?  Healthy volunteers who are fully evaluated to make sure their blood is safe. This is blood bank blood. Transfusion therapy is the safest it has ever been in the practice of medicine. Before blood is taken from a donor, a complete history is taken to make sure that person has no history of diseases nor engages in risky social behavior (examples are intravenous drug use or sexual activity with multiple partners). The donor's travel history is screened to minimize risk of  transmitting infections, such as malaria. The donated blood is tested for signs of infectious diseases,  such as HIV and hepatitis. The blood is then tested to be sure it is compatible with you in order to minimize the chance of a transfusion reaction. If you or a relative donates blood, this is often done in anticipation of surgery and is not appropriate for emergency situations. It takes many days to process the donated blood. RISKS AND COMPLICATIONS Although transfusion therapy is very safe and saves many lives, the main dangers of transfusion include:  Getting an infectious disease. Developing a transfusion reaction. This is an allergic reaction to something in the blood you were given. Every precaution is taken to prevent this. The decision to have a blood transfusion has been considered carefully by your caregiver before blood is given. Blood is not given unless the benefits outweigh the risks. AFTER THE TRANSFUSION Right after receiving a blood transfusion, you will usually feel much better and more energetic. This is especially true if your red blood cells have gotten low (anemic). The transfusion raises the level of the red blood cells which carry oxygen, and this usually causes an energy increase. The nurse administering the transfusion will monitor you carefully for complications. HOME CARE INSTRUCTIONS  No special instructions are needed after a transfusion. You may find your energy is better. Speak with your caregiver about any limitations on activity for underlying diseases you may have. SEEK MEDICAL CARE IF:  Your condition is not improving after your transfusion. You develop redness or irritation at the intravenous (IV) site. SEEK IMMEDIATE MEDICAL CARE IF:  Any of the following symptoms occur over the next 12 hours: Shaking chills. You have a temperature by mouth above 102 F (38.9 C), not controlled by medicine. Chest, back, or muscle pain. People around you feel you are not acting correctly or are confused. Shortness of breath or difficulty breathing. Dizziness and  fainting. You get a rash or develop hives. You have a decrease in urine output. Your urine turns a dark color or changes to pink, red, or brown. Any of the following symptoms occur over the next 10 days: You have a temperature by mouth above 102 F (38.9 C), not controlled by medicine. Shortness of breath. Weakness after normal activity. The white part of the eye turns yellow (jaundice). You have a decrease in the amount of urine or are urinating less often. Your urine turns a dark color or changes to pink, red, or brown. Document Released: 12/27/1999 Document Revised: 03/23/2011 Document Reviewed: 08/15/2007 [facility] Patient Information 2014 [place], [place].  _______________________________________________________________________

## 2022-03-16 ENCOUNTER — Other Ambulatory Visit: Payer: Self-pay

## 2022-03-16 ENCOUNTER — Encounter (HOSPITAL_COMMUNITY)
Admission: RE | Admit: 2022-03-16 | Discharge: 2022-03-16 | Disposition: A | Discharge status: Home / Self Care | Payer: Medicare Other | Source: Ambulatory Visit | Attending: Orthopedic Surgery | Admitting: Orthopedic Surgery

## 2022-03-16 ENCOUNTER — Encounter (HOSPITAL_COMMUNITY): Payer: Self-pay

## 2022-03-16 VITALS — BP 136/90 | HR 78 | Temp 98.7°F | Resp 14 | Ht 61.0 in | Wt 160.0 lb

## 2022-03-16 DIAGNOSIS — Z01818 Encounter for other preprocedural examination: Secondary | ICD-10-CM

## 2022-03-16 DIAGNOSIS — Z9221 Personal history of antineoplastic chemotherapy: Secondary | ICD-10-CM | POA: Diagnosis not present

## 2022-03-16 HISTORY — DX: Malignant (primary) neoplasm, unspecified: C80.1

## 2022-03-16 LAB — CBC
HCT: 42.7 % (ref 36.0–46.0)
Hemoglobin: 13.9 g/dL (ref 12.0–15.0)
MCH: 30.4 pg (ref 26.0–34.0)
MCHC: 32.6 g/dL (ref 30.0–36.0)
MCV: 93.4 fL (ref 80.0–100.0)
Platelets: 271 10*3/uL (ref 150–400)
RBC: 4.57 MIL/uL (ref 3.87–5.11)
RDW: 12.5 % (ref 11.5–15.5)
WBC: 8.4 10*3/uL (ref 4.0–10.5)
nRBC: 0 % (ref 0.0–0.2)

## 2022-03-16 LAB — BASIC METABOLIC PANEL
Anion gap: 10 (ref 5–15)
BUN: 17 mg/dL (ref 8–23)
CO2: 24 mmol/L (ref 22–32)
Calcium: 8.8 mg/dL — ABNORMAL LOW (ref 8.9–10.3)
Chloride: 101 mmol/L (ref 98–111)
Creatinine, Ser: 0.55 mg/dL (ref 0.44–1.00)
GFR, Estimated: >60 mL/min (ref >60)
Glucose, Bld: 79 mg/dL (ref 70–99)
Potassium: 4.5 mmol/L (ref 3.5–5.1)
Sodium: 135 mmol/L (ref 135–145)

## 2022-03-16 LAB — SURGICAL PCR SCREEN
MRSA, PCR: NEGATIVE
Staphylococcus aureus: NEGATIVE

## 2022-03-17 ENCOUNTER — Inpatient Hospital Stay: Payer: Medicare Other | Attending: Hematology & Oncology | Admitting: Hematology & Oncology

## 2022-03-17 ENCOUNTER — Encounter: Payer: Self-pay | Admitting: *Deleted

## 2022-03-17 ENCOUNTER — Inpatient Hospital Stay: Payer: Medicare Other

## 2022-03-17 ENCOUNTER — Encounter: Payer: Self-pay | Admitting: Hematology & Oncology

## 2022-03-17 VITALS — BP 164/89 | HR 74 | Temp 98.4°F | Resp 20 | Ht 61.0 in | Wt 160.0 lb

## 2022-03-17 DIAGNOSIS — Z853 Personal history of malignant neoplasm of breast: Secondary | ICD-10-CM | POA: Diagnosis not present

## 2022-03-17 DIAGNOSIS — Z87891 Personal history of nicotine dependence: Secondary | ICD-10-CM | POA: Diagnosis not present

## 2022-03-17 DIAGNOSIS — Z923 Personal history of irradiation: Secondary | ICD-10-CM | POA: Insufficient documentation

## 2022-03-17 DIAGNOSIS — Z79899 Other long term (current) drug therapy: Secondary | ICD-10-CM | POA: Diagnosis not present

## 2022-03-17 DIAGNOSIS — Z9221 Personal history of antineoplastic chemotherapy: Secondary | ICD-10-CM | POA: Diagnosis not present

## 2022-03-17 DIAGNOSIS — C50912 Malignant neoplasm of unspecified site of left female breast: Secondary | ICD-10-CM | POA: Diagnosis not present

## 2022-03-17 DIAGNOSIS — E039 Hypothyroidism, unspecified: Secondary | ICD-10-CM | POA: Diagnosis not present

## 2022-03-17 NOTE — Progress Notes (Signed)
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  : I have another breast cancer.  HPI: Ms. Wade is well-known to me.  I have known her prior for about 25 years.  She is very nice 77 year old white female.  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  She underwent lumpectomy followed by chemotherapy and radiation therapy.  This was all completed back in 2004.  She was then found to have a lump in the right breast.  This was found on mammogram.  Subsequently, she underwent a lumpectomy on 03/02/2022.  Dr. Donne Hazel did the surgery.  The pathology report ([id number]) showed a 9 mm infiltrating ductal carcinoma.  All margins were negative.  The tumor had a proliferation index of only 1%.  She had no lymphovascular invasion.  The tumor was ER positive/PR positive/HER2 negative.  There were no lymph nodes submitted.  She has recovered well.  She has had no problems with the surgery.  She is going have hip surgery next week.  This is for the right hip.  It sounds like she also will need left knee surgery.  She has had no cough or shortness of breath.  She has had no nausea or vomiting.  There has been no change in bowel or bladder habits.  Overall, I would say her performance status is probably ECOG 0.    Past Medical History:  Diagnosis Date   Arthritis    Cancer ([place])    breast cancer   GERD (gastroesophageal reflux disease)    Hypothyroidism   :   Past Surgical History:  Procedure Laterality Date   ABDOMINAL HYSTERECTOMY     BIOPSY  06/25/2021   Procedure: BIOPSY;  Surgeon: Rogene Houston, MD;  [facility];  Service: Endoscopy;;   BREAST BIOPSY Right 01/27/2022   [place] RT BREAST BX W LOC DEV 1ST LESION IMG BX [place] US GUIDE 01/27/2022 Jamie Kato, MD [facility]ULTRASOUND   BREAST BIOPSY  02/27/2022   MM RT RADIOACTIVE SEED LOC MAMMO GUIDE 02/27/2022 [facility] MAMMOGRAPHY   BREAST LUMPECTOMY Left 1999   BREAST LUMPECTOMY WITH RADIOACTIVE SEED LOCALIZATION Right 03/02/2022   Procedure: RIGHT BREAST LUMPECTOMY WITH RADIOACTIVE SEED LOCALIZATION;  Surgeon: Rolm Bookbinder, MD;  Location: [place];  Service: General;  Laterality: Right;   COLONOSCOPY WITH PROPOFOL N/A 06/25/2021   Procedure: COLONOSCOPY WITH PROPOFOL;  Surgeon: Rogene Houston, MD;  [facility];  Service: Endoscopy;  Laterality: N/A;  1010   ESOPHAGOGASTRODUODENOSCOPY (EGD) WITH PROPOFOL N/A 06/25/2021   Procedure: ESOPHAGOGASTRODUODENOSCOPY (EGD) WITH PROPOFOL;  Surgeon: Rogene Houston, MD;  [facility];  Service: Endoscopy;  Laterality: N/A;   EYE SURGERY Bilateral 2017   cataracts removed   MALONEY DILATION  06/25/2021   Procedure: MALONEY DILATION;  Surgeon: Rogene Houston, MD;  [facility];  Service: Endoscopy;;   TONSILLECTOMY    :   Current Outpatient Medications:    acetaminophen (TYLENOL) 500 MG tablet, Take 1,000 mg by mouth every 6 (six) hours as needed for moderate pain., Disp: , Rfl:    Calcium Carb-Cholecalciferol (CALCIUM 600+D) 600-800 MG-UNIT TABS, Take 1 tablet by mouth every evening., Disp: , Rfl:    Cyanocobalamin (VITAMIN B-12) 2500 MCG SUBL, Take 2,500  mcg by mouth every evening., Disp: , Rfl:    diclofenac Sodium (VOLTAREN) 1 % GEL, Apply 1 Application topically 2 (two) times daily as needed (joint pain)., Disp: , Rfl:    Glucosamine-Chondroitin (COSAMIN DS PO), Take 1 tablet by mouth in the morning and at bedtime. 1500-1200, Disp: , Rfl:    latanoprost (XALATAN) 0.005 % ophthalmic solution, Place 1 drop into both eyes at bedtime., Disp: , Rfl:    Magnesium 500 MG TABS, Take 500 mg by mouth daily in the afternoon., Disp: , Rfl:    SYNTHROID 112 MCG tablet, Take 112 mcg by mouth  daily before breakfast., Disp: , Rfl:    timolol (TIMOPTIC) 0.5 % ophthalmic solution, Place 1 drop into both eyes daily., Disp: , Rfl:    traMADol (ULTRAM) 50 MG tablet, Take 1 tablet (50 mg total) by mouth every 6 (six) hours as needed. (Patient not taking: Reported on 03/13/2022), Disp: 10 tablet, Rfl: 0:  :  No Known Allergies:  History reviewed. No pertinent family history.:   Social History   Socioeconomic History   Marital status: Married    Spouse name: Not on file   Number of children: Not on file   Years of education: Not on file   Highest education level: Not on file  Occupational History   Not on file  Tobacco Use   Smoking status: Former    Packs/day: 1.00    Years: 40.00    Total pack years: 40.00    Types: Cigarettes    Quit date: 2007    Years since quitting: 17.1   Smokeless tobacco: Never   Tobacco comments:    Quit 10 years ago  Vaping Use   Vaping Use: Never used  Substance and Sexual Activity   Alcohol use: Yes    Alcohol/week: 1.0 standard drink of alcohol    Types: 1 Standard drinks or equivalent per week   Drug use: Not Currently   Sexual activity: Not Currently  Other Topics Concern   Not on file  Social History Narrative   Not on file   Social Determinants of Health   Financial Resource Strain: Not on file  Food Insecurity: No Food Insecurity (02/17/2022)   Hunger Vital Sign    Worried About Running Out of Food in the Last Year: Never true    Ran Out of Food in the Last Year: Never true  Transportation Needs: No Transportation Needs (02/17/2022)   PRAPARE - Hydrologist (Medical): No    Lack of Transportation (Non-Medical): No  Physical Activity: Not on file  Stress: Not on file  Social Connections: Not on file  Intimate Partner Violence: Not At Risk (02/17/2022)   Humiliation, Afraid, Rape, and Kick questionnaire    Fear of Current or Ex-Partner: No    Emotionally Abused: No    Physically Abused: No     Sexually Abused: No  :  Review of Systems  Constitutional: Negative.   HENT: Negative.    Eyes: Negative.   Respiratory: Negative.    Cardiovascular: Negative.   Gastrointestinal: Negative.   Genitourinary: Negative.   Musculoskeletal:  Positive for joint pain.  Skin: Negative.   Neurological: Negative.   Endo/Heme/Allergies: Negative.   Psychiatric/Behavioral: Negative.       Exam: Vital signs show temperature of 98.4.  Pulse 74.  Blood pressure 164/89.  Weight is 160 pounds.  '@IPVITALS'$ @ Physical Exam Vitals reviewed.  Constitutional:      Comments: Breast exam  shows right breast with the healing lumpectomy scar at about the 10 o'clock position.  There is no erythema.  There is no nipple discharge.  There is no swelling.  There is no right axillary adenopathy.  Left breast shows the old, healed lumpectomy at about the 4 o'clock position.  HENT:     Head: Normocephalic and atraumatic.  Eyes:     Pupils: Pupils are equal, round, and reactive to light.  Cardiovascular:     Rate and Rhythm: Normal rate and regular rhythm.     Heart sounds: Normal heart sounds.  Pulmonary:     Effort: Pulmonary effort is normal.     Breath sounds: Normal breath sounds.  Abdominal:     General: Bowel sounds are normal.     Palpations: Abdomen is soft.  Musculoskeletal:        General: No tenderness or deformity. Normal range of motion.     Cervical back: Normal range of motion.  Lymphadenopathy:     Cervical: No cervical adenopathy.  Skin:    General: Skin is warm and dry.     Findings: No erythema or rash.  Neurological:     Mental Status: She is alert and oriented to person, place, and time.  Psychiatric:        Behavior: Behavior normal.        Thought Content: Thought content normal.        Judgment: Judgment normal.     Recent Labs    03/16/22 1030  WBC 8.4  HGB 13.9  HCT 42.7  PLT 271    Recent Labs    03/16/22 1030  NA 135  K 4.5  CL 101  CO2 24  GLUCOSE 79   BUN 17  CREATININE 0.55  CALCIUM 8.8*    Blood smear review: None  Pathology: See above    Assessment and Plan: Ms. Nuttle is a very charming 77 year old postmenopausal white female.  She has a second breast cancer.  This is a second primary for her.  She has a incredibly favorable breast cancer.  She has a very low Oncotype score.  She has a very small cancer.  She clearly does not need chemotherapy.  I would certainly put her on antiestrogen therapy.  I would put her on Femara.  However, I will wait until after she has her hip surgery and radiation.  She is already seen Dr. Eppie Gibson of Radiation Oncology.  I think that Ms.Roethler would be amenable to the very short course of radiotherapy.  I do not know where this would have to be scheduled.  I would think this would be scheduled after her hip surgery.  I think that her prognosis is incredibly good.  I think the risk of recurrence should be less than 5%.  I do not see a problem with her having hip surgery.  I would like to see her back in about 6 weeks.  By then, she should be through any radiation that she has.  She also should be able to come in after having the hip surgery.

## 2022-03-17 NOTE — Progress Notes (Signed)
Patient came into today after her surgery. She has low risk breast cancer with an oncotype score of 9. She is scheduled for hip replacment on 03/25/2022 and Dr Marin Olp will defer starting AI before that time.  Spoke to radiation and per their plan, patient wished to start radiation after her hip replacement and travel in May. Will notify Dr Marin Olp.   Oncology Nurse Navigator Documentation     03/17/2022   11:00 AM  Oncology Nurse Navigator Flowsheets  Navigator Follow Up Date: 04/29/2022  Navigator Follow Up Reason: Follow-up Appointment  Navigator Location [facility]  Navigator Encounter Type Appt/Treatment Plan Review  Patient Visit Type MedOnc  Treatment Phase Active Tx  Barriers/Navigation Needs Coordination of Care;Education  Interventions None Required  Acuity Level 2-Minimal Needs (1-2 Barriers Identified)  Support Groups/Services Friends and Family  Time Spent with Patient 15

## 2022-03-18 ENCOUNTER — Ambulatory Visit: Payer: Medicare Other | Admitting: Hematology & Oncology

## 2022-03-18 ENCOUNTER — Other Ambulatory Visit: Payer: Medicare Other

## 2022-03-24 ENCOUNTER — Ambulatory Visit: Payer: Self-pay | Admitting: Student

## 2022-03-24 NOTE — H&P (View-Only) (Signed)
TOTAL HIP ADMISSION H&P  Patient is admitted for right total hip arthroplasty.  Subjective:  Chief Complaint: right hip pain  HPI: Lauren [name], 77 y.o. female, has a history of pain and functional disability in the right hip(s) due to arthritis and patient has failed non-surgical conservative treatments for greater than 12 weeks to include NSAID's and/or analgesics, flexibility and strengthening excercises, use of assistive devices, and activity modification.  Onset of symptoms was gradual starting 9 years ago with rapidlly worsening course since that time.The patient noted no past surgery on the right hip(s).  Patient currently rates pain in the right hip at 10 out of 10 with activity. Patient has night pain, worsening of pain with activity and weight bearing, trendelenberg gait, pain that interfers with activities of daily living, and pain with passive range of motion. Patient has evidence of subchondral cysts, subchondral sclerosis, periarticular osteophytes, and joint space narrowing by imaging studies. This condition presents safety issues increasing the risk of falls.  There is no current active infection.  Patient Active Problem List   Diagnosis Date Noted   Malignant neoplasm of upper-outer quadrant of right breast in female, estrogen receptor positive ([place]) 02/17/2022   Stage I breast cancer, left ([place]) 11/02/2016   Past Medical History:  Diagnosis Date   Arthritis    Cancer ([place])    breast cancer   GERD (gastroesophageal reflux disease)    Hypothyroidism     Past Surgical History:  Procedure Laterality Date   ABDOMINAL HYSTERECTOMY     BIOPSY  06/25/2021   Procedure: BIOPSY;  Surgeon: Rogene Houston, MD;  [facility];  Service: Endoscopy;;   BREAST BIOPSY Right 01/27/2022   [place] RT BREAST BX W LOC DEV 1ST LESION IMG BX [place] US GUIDE 01/27/2022 Jamie Kato, MD [facility]   BREAST BIOPSY  02/27/2022   MM RT RADIOACTIVE SEED LOC MAMMO GUIDE  02/27/2022 [facility]   BREAST LUMPECTOMY Left 1999   BREAST LUMPECTOMY WITH RADIOACTIVE SEED LOCALIZATION Right 03/02/2022   Procedure: RIGHT BREAST LUMPECTOMY WITH RADIOACTIVE SEED LOCALIZATION;  Surgeon: Rolm Bookbinder, MD;  Location: [place];  Service: General;  Laterality: Right;   COLONOSCOPY WITH PROPOFOL N/A 06/25/2021   Procedure: COLONOSCOPY WITH PROPOFOL;  Surgeon: Rogene Houston, MD;  [facility];  Service: Endoscopy;  Laterality: N/A;  1010   ESOPHAGOGASTRODUODENOSCOPY (EGD) WITH PROPOFOL N/A 06/25/2021   Procedure: ESOPHAGOGASTRODUODENOSCOPY (EGD) WITH PROPOFOL;  Surgeon: Rogene Houston, MD;  [facility];  Service: Endoscopy;  Laterality: N/A;   EYE SURGERY Bilateral 2017   cataracts removed   MALONEY DILATION  06/25/2021   Procedure: MALONEY DILATION;  Surgeon: Rogene Houston, MD;  [facility];  Service: Endoscopy;;   TONSILLECTOMY      Current Outpatient Medications  Medication Sig Dispense Refill Last Dose   acetaminophen (TYLENOL) 500 MG tablet Take 1,000 mg by mouth every 6 (six) hours as needed for moderate pain.      Calcium Carb-Cholecalciferol (CALCIUM 600+D) 600-800 MG-UNIT TABS Take 1 tablet by mouth every evening.      Cyanocobalamin (VITAMIN B-12) 2500 MCG SUBL Take 2,500 mcg by mouth every evening.      diclofenac Sodium (VOLTAREN) 1 % GEL Apply 1 Application topically 2 (two) times daily as needed (joint pain).      Glucosamine-Chondroitin (COSAMIN DS PO) Take 1 tablet by mouth in the morning and at bedtime. 1500-1200      latanoprost (XALATAN)  0.005 % ophthalmic solution Place 1 drop into both eyes at bedtime.      Magnesium 500 MG TABS Take 500 mg by mouth daily in the afternoon.      SYNTHROID 112 MCG tablet Take 112 mcg by mouth daily before breakfast.      timolol (TIMOPTIC) 0.5 % ophthalmic solution Place 1 drop into both eyes daily.      traMADol (ULTRAM) 50 MG tablet Take 1 tablet (50  mg total) by mouth every 6 (six) hours as needed. (Patient not taking: Reported on 03/13/2022) 10 tablet 0    No current facility-administered medications for this visit.   No Known Allergies  Social History   Tobacco Use   Smoking status: Former    Packs/day: 1.00    Years: 40.00    Total pack years: 40.00    Types: Cigarettes    Quit date: 2007    Years since quitting: 17.2   Smokeless tobacco: Never   Tobacco comments:    Quit 10 years ago  Substance Use Topics   Alcohol use: Yes    Alcohol/week: 1.0 standard drink of alcohol    Types: 1 Standard drinks or equivalent per week    No family history on file.   Review of Systems  Musculoskeletal:  Positive for arthralgias and gait problem.  All other systems reviewed and are negative.   Objective:  Physical Exam Constitutional:      Appearance: Normal appearance.  HENT:     Head: Normocephalic and atraumatic.     Nose: Nose normal.     Mouth/Throat:     Mouth: Mucous membranes are moist.     Pharynx: Oropharynx is clear.  Eyes:     Conjunctiva/sclera: Conjunctivae normal.  Cardiovascular:     Rate and Rhythm: Normal rate and regular rhythm.     Pulses: Normal pulses.     Heart sounds: Normal heart sounds.  Pulmonary:     Effort: Pulmonary effort is normal.  Abdominal:     General: Abdomen is flat.     Palpations: Abdomen is soft.  Genitourinary:    Comments: deferred Musculoskeletal:     Cervical back: Normal range of motion and neck supple.     Comments: Examination of the right hip reveals no skin wounds or lesions. Mild trochanteric tenderness to palpation. She has severely restricted range of motion of the right hip. She has pain with terminal flexion and rotation.   Distally, there is no focal motor or sensory deficit. She has palpable pedal pulses.  She ambulates with an antalgic gait.  Skin:    General: Skin is warm and dry.     Capillary Refill: Capillary refill takes less than 2 seconds.   Neurological:     General: No focal deficit present.     Mental Status: She is alert and oriented to person, place, and time.  Psychiatric:        Mood and Affect: Mood normal.        Behavior: Behavior normal.        Thought Content: Thought content normal.        Judgment: Judgment normal.     Vital signs in last 24 hours: '@VSRANGES'$ @  Labs:   Estimated body mass index is 30.23 kg/m as calculated from the following:   Height as of 03/17/22: '5\' 1"'$  (1.549 m).   Weight as of 03/17/22: 72.6 kg.   Imaging Review Plain radiographs demonstrate severe degenerative joint disease of the right hip(s).  The bone quality appears to be adequate for age and reported activity level.      Assessment/Plan:  End stage arthritis, right hip(s)  The patient history, physical examination, clinical judgement of the provider and imaging studies are consistent with end stage degenerative joint disease of the right hip(s) and total hip arthroplasty is deemed medically necessary. The treatment options including medical management, injection therapy, arthroscopy and arthroplasty were discussed at length. The risks and benefits of total hip arthroplasty were presented and reviewed. The risks due to aseptic loosening, infection, stiffness, dislocation/subluxation,  thromboembolic complications and other imponderables were discussed.  The patient acknowledged the explanation, agreed to proceed with the plan and consent was signed. Patient is being admitted for inpatient treatment for surgery, pain control, PT, OT, prophylactic antibiotics, VTE prophylaxis, progressive ambulation and ADL's and discharge planning.The patient is planning to be discharged home with HEP.   Therapy Plans: HEP.  Disposition: Home with husband Planned DVT Prophylaxis: Eliquis 2.'5mg'$  BID DME needed: walker PCP: Cleared.  TXA: IV Allergies: NDKA.  Anesthesia Concerns: None.  BMI: 31.2 Last HgbA1c: Not diabetic Other: - Had  lumpectomy 03/02/22. Might have to undergo radiation. Has appt with oncology 03/17/22.  - Psoriatic arthritis, methotrexate. Hold methotrexate for 2 weeks before surgery and postoperatively.  - Hydrocodone, zofran.  - 03/16/22: Hgb 13.9,  K+ 4.5, Cr. 0.55.    Patient's anticipated LOS is less than 2 midnights, meeting these requirements: - Younger than 20 - Lives within 1 hour of care - Has a competent adult at home to recover with post-op recover - NO history of  - Chronic pain requiring opiods  - Diabetes  - Coronary Artery Disease  - Heart failure  - Heart attack  - Stroke  - DVT/VTE  - Cardiac arrhythmia  - Respiratory Failure/COPD  - Renal failure  - Anemia  - Advanced Liver disease

## 2022-03-24 NOTE — H&P (Addendum)
TOTAL HIP ADMISSION H&P  Patient is admitted for right total hip arthroplasty.  Subjective:  Chief Complaint: right hip pain  HPI: Lauren [name], 77 y.o. female, has a history of pain and functional disability in the right hip(s) due to arthritis and patient has failed non-surgical conservative treatments for greater than 12 weeks to include NSAID's and/or analgesics, flexibility and strengthening excercises, use of assistive devices, and activity modification.  Onset of symptoms was gradual starting 9 years ago with rapidlly worsening course since that time.The patient noted no past surgery on the right hip(s).  Patient currently rates pain in the right hip at 10 out of 10 with activity. Patient has night pain, worsening of pain with activity and weight bearing, trendelenberg gait, pain that interfers with activities of daily living, and pain with passive range of motion. Patient has evidence of subchondral cysts, subchondral sclerosis, periarticular osteophytes, and joint space narrowing by imaging studies. This condition presents safety issues increasing the risk of falls.  There is no current active infection.  Patient Active Problem List   Diagnosis Date Noted   Malignant neoplasm of upper-outer quadrant of right breast in female, estrogen receptor positive ([place]) 02/17/2022   Stage I breast cancer, left ([place]) 11/02/2016   Past Medical History:  Diagnosis Date   Arthritis    Cancer ([place])    breast cancer   GERD (gastroesophageal reflux disease)    Hypothyroidism     Past Surgical History:  Procedure Laterality Date   ABDOMINAL HYSTERECTOMY     BIOPSY  06/25/2021   Procedure: BIOPSY;  Surgeon: Rogene Houston, MD;  [facility];  Service: Endoscopy;;   BREAST BIOPSY Right 01/27/2022   [place] RT BREAST BX W LOC DEV 1ST LESION IMG BX [place] US GUIDE 01/27/2022 Jamie Kato, MD [facility]   BREAST BIOPSY  02/27/2022   MM RT RADIOACTIVE SEED LOC MAMMO GUIDE  02/27/2022 [facility]   BREAST LUMPECTOMY Left 1999   BREAST LUMPECTOMY WITH RADIOACTIVE SEED LOCALIZATION Right 03/02/2022   Procedure: RIGHT BREAST LUMPECTOMY WITH RADIOACTIVE SEED LOCALIZATION;  Surgeon: Rolm Bookbinder, MD;  Location: [place];  Service: General;  Laterality: Right;   COLONOSCOPY WITH PROPOFOL N/A 06/25/2021   Procedure: COLONOSCOPY WITH PROPOFOL;  Surgeon: Rogene Houston, MD;  [facility];  Service: Endoscopy;  Laterality: N/A;  1010   ESOPHAGOGASTRODUODENOSCOPY (EGD) WITH PROPOFOL N/A 06/25/2021   Procedure: ESOPHAGOGASTRODUODENOSCOPY (EGD) WITH PROPOFOL;  Surgeon: Rogene Houston, MD;  [facility];  Service: Endoscopy;  Laterality: N/A;   EYE SURGERY Bilateral 2017   cataracts removed   MALONEY DILATION  06/25/2021   Procedure: MALONEY DILATION;  Surgeon: Rogene Houston, MD;  [facility];  Service: Endoscopy;;   TONSILLECTOMY      Current Outpatient Medications  Medication Sig Dispense Refill Last Dose   acetaminophen (TYLENOL) 500 MG tablet Take 1,000 mg by mouth every 6 (six) hours as needed for moderate pain.      Calcium Carb-Cholecalciferol (CALCIUM 600+D) 600-800 MG-UNIT TABS Take 1 tablet by mouth every evening.      Cyanocobalamin (VITAMIN B-12) 2500 MCG SUBL Take 2,500 mcg by mouth every evening.      diclofenac Sodium (VOLTAREN) 1 % GEL Apply 1 Application topically 2 (two) times daily as needed (joint pain).      Glucosamine-Chondroitin (COSAMIN DS PO) Take 1 tablet by mouth in the morning and at bedtime. 1500-1200      latanoprost (XALATAN)  0.005 % ophthalmic solution Place 1 drop into both eyes at bedtime.      Magnesium 500 MG TABS Take 500 mg by mouth daily in the afternoon.      SYNTHROID 112 MCG tablet Take 112 mcg by mouth daily before breakfast.      timolol (TIMOPTIC) 0.5 % ophthalmic solution Place 1 drop into both eyes daily.      traMADol (ULTRAM) 50 MG tablet Take 1 tablet (50  mg total) by mouth every 6 (six) hours as needed. (Patient not taking: Reported on 03/13/2022) 10 tablet 0    No current facility-administered medications for this visit.   No Known Allergies  Social History   Tobacco Use   Smoking status: Former    Packs/day: 1.00    Years: 40.00    Total pack years: 40.00    Types: Cigarettes    Quit date: 2007    Years since quitting: 17.2   Smokeless tobacco: Never   Tobacco comments:    Quit 10 years ago  Substance Use Topics   Alcohol use: Yes    Alcohol/week: 1.0 standard drink of alcohol    Types: 1 Standard drinks or equivalent per week    No family history on file.   Review of Systems  Musculoskeletal:  Positive for arthralgias and gait problem.  All other systems reviewed and are negative.   Objective:  Physical Exam Constitutional:      Appearance: Normal appearance.  HENT:     Head: Normocephalic and atraumatic.     Nose: Nose normal.     Mouth/Throat:     Mouth: Mucous membranes are moist.     Pharynx: Oropharynx is clear.  Eyes:     Conjunctiva/sclera: Conjunctivae normal.  Cardiovascular:     Rate and Rhythm: Normal rate and regular rhythm.     Pulses: Normal pulses.     Heart sounds: Normal heart sounds.  Pulmonary:     Effort: Pulmonary effort is normal.  Abdominal:     General: Abdomen is flat.     Palpations: Abdomen is soft.  Genitourinary:    Comments: deferred Musculoskeletal:     Cervical back: Normal range of motion and neck supple.     Comments: Examination of the right hip reveals no skin wounds or lesions. Mild trochanteric tenderness to palpation. She has severely restricted range of motion of the right hip. She has pain with terminal flexion and rotation.   Distally, there is no focal motor or sensory deficit. She has palpable pedal pulses.  She ambulates with an antalgic gait.  Skin:    General: Skin is warm and dry.     Capillary Refill: Capillary refill takes less than 2 seconds.   Neurological:     General: No focal deficit present.     Mental Status: She is alert and oriented to person, place, and time.  Psychiatric:        Mood and Affect: Mood normal.        Behavior: Behavior normal.        Thought Content: Thought content normal.        Judgment: Judgment normal.     Vital signs in last 24 hours: '@VSRANGES'$ @  Labs:   Estimated body mass index is 30.23 kg/m as calculated from the following:   Height as of 03/17/22: '5\' 1"'$  (1.549 m).   Weight as of 03/17/22: 72.6 kg.   Imaging Review Plain radiographs demonstrate severe degenerative joint disease of the right hip(s).  The bone quality appears to be adequate for age and reported activity level.      Assessment/Plan:  End stage arthritis, right hip(s)  The patient history, physical examination, clinical judgement of the provider and imaging studies are consistent with end stage degenerative joint disease of the right hip(s) and total hip arthroplasty is deemed medically necessary. The treatment options including medical management, injection therapy, arthroscopy and arthroplasty were discussed at length. The risks and benefits of total hip arthroplasty were presented and reviewed. The risks due to aseptic loosening, infection, stiffness, dislocation/subluxation,  thromboembolic complications and other imponderables were discussed.  The patient acknowledged the explanation, agreed to proceed with the plan and consent was signed. Patient is being admitted for inpatient treatment for surgery, pain control, PT, OT, prophylactic antibiotics, VTE prophylaxis, progressive ambulation and ADL's and discharge planning.The patient is planning to be discharged home with HEP.   Therapy Plans: HEP.  Disposition: Home with husband Planned DVT Prophylaxis: Eliquis 2.'5mg'$  BID DME needed: walker PCP: Cleared.  TXA: IV Allergies: NDKA.  Anesthesia Concerns: None.  BMI: 31.2 Last HgbA1c: Not diabetic Other: - Had  lumpectomy 03/02/22. Might have to undergo radiation. Has appt with oncology 03/17/22.  - Psoriatic arthritis, methotrexate. Hold methotrexate for 2 weeks before surgery and postoperatively.  - Hydrocodone, zofran.  - 03/16/22: Hgb 13.9,  K+ 4.5, Cr. 0.55.    Patient's anticipated LOS is less than 2 midnights, meeting these requirements: - Younger than 66 - Lives within 1 hour of care - Has a competent adult at home to recover with post-op recover - NO history of  - Chronic pain requiring opiods  - Diabetes  - Coronary Artery Disease  - Heart failure  - Heart attack  - Stroke  - DVT/VTE  - Cardiac arrhythmia  - Respiratory Failure/COPD  - Renal failure  - Anemia  - Advanced Liver disease

## 2022-03-25 ENCOUNTER — Ambulatory Visit (HOSPITAL_BASED_OUTPATIENT_CLINIC_OR_DEPARTMENT_OTHER): Payer: Medicare Other | Admitting: Anesthesiology

## 2022-03-25 ENCOUNTER — Ambulatory Visit (HOSPITAL_COMMUNITY): Payer: Medicare Other

## 2022-03-25 ENCOUNTER — Ambulatory Visit (HOSPITAL_COMMUNITY)
Admission: RE | Admit: 2022-03-25 | Discharge: 2022-03-25 | Disposition: A | Discharge status: Home / Self Care | Payer: Medicare Other | Source: Ambulatory Visit | Attending: Orthopedic Surgery | Admitting: Orthopedic Surgery

## 2022-03-25 ENCOUNTER — Encounter (HOSPITAL_COMMUNITY): Payer: Self-pay | Admitting: Orthopedic Surgery

## 2022-03-25 ENCOUNTER — Other Ambulatory Visit: Payer: Self-pay

## 2022-03-25 ENCOUNTER — Ambulatory Visit (HOSPITAL_COMMUNITY): Payer: Medicare Other | Admitting: Anesthesiology

## 2022-03-25 ENCOUNTER — Encounter (HOSPITAL_COMMUNITY)
Admission: RE | Disposition: A | Discharge status: Home / Self Care | Payer: Self-pay | Source: Ambulatory Visit | Attending: Orthopedic Surgery

## 2022-03-25 DIAGNOSIS — Z96641 Presence of right artificial hip joint: Secondary | ICD-10-CM | POA: Diagnosis not present

## 2022-03-25 DIAGNOSIS — L4050 Arthropathic psoriasis, unspecified: Secondary | ICD-10-CM | POA: Insufficient documentation

## 2022-03-25 DIAGNOSIS — Z79899 Other long term (current) drug therapy: Secondary | ICD-10-CM | POA: Insufficient documentation

## 2022-03-25 DIAGNOSIS — M1611 Unilateral primary osteoarthritis, right hip: Secondary | ICD-10-CM | POA: Insufficient documentation

## 2022-03-25 DIAGNOSIS — E039 Hypothyroidism, unspecified: Secondary | ICD-10-CM | POA: Diagnosis not present

## 2022-03-25 DIAGNOSIS — Z471 Aftercare following joint replacement surgery: Secondary | ICD-10-CM | POA: Diagnosis not present

## 2022-03-25 HISTORY — PX: TOTAL HIP ARTHROPLASTY: SHX124

## 2022-03-25 LAB — ABO/RH: ABO/RH(D): AB POS

## 2022-03-25 LAB — TYPE AND SCREEN
ABO/RH(D): AB POS
Antibody Screen: NEGATIVE

## 2022-03-25 SURGERY — ARTHROPLASTY, HIP, TOTAL, ANTERIOR APPROACH
Anesthesia: Spinal | Site: Hip | Laterality: Right

## 2022-03-25 MED ORDER — LACTATED RINGERS IV BOLUS
500.0000 mL | Freq: Once | INTRAVENOUS | Status: AC
  Administered 2022-03-25: 500 mL via INTRAVENOUS

## 2022-03-25 MED ORDER — METHOCARBAMOL 500 MG IVPB - SIMPLE MED
500.0000 mg | Freq: Four times a day (QID) | INTRAVENOUS | Status: DC | PRN
  Administered 2022-03-25: 500 mg via INTRAVENOUS

## 2022-03-25 MED ORDER — OXYCODONE HCL 5 MG PO TABS
5.0000 mg | ORAL_TABLET | Freq: Once | ORAL | Status: AC | PRN
  Administered 2022-03-25: 5 mg via ORAL

## 2022-03-25 MED ORDER — CEFAZOLIN SODIUM-DEXTROSE 2-4 GM/100ML-% IV SOLN
INTRAVENOUS | Status: AC
  Filled 2022-03-25: qty 100

## 2022-03-25 MED ORDER — ONDANSETRON HCL 4 MG PO TABS: 4.0000 mg | ORAL_TABLET | Freq: Four times a day (QID) | ORAL | Status: DC | PRN

## 2022-03-25 MED ORDER — HYDROMORPHONE HCL 1 MG/ML IJ SOLN
INTRAMUSCULAR | Status: AC
  Filled 2022-03-25: qty 1

## 2022-03-25 MED ORDER — CEFAZOLIN SODIUM-DEXTROSE 2-4 GM/100ML-% IV SOLN
2.0000 g | Freq: Four times a day (QID) | INTRAVENOUS | Status: DC
  Administered 2022-03-25: 2 g via INTRAVENOUS

## 2022-03-25 MED ORDER — DEXAMETHASONE SODIUM PHOSPHATE 10 MG/ML IJ SOLN
INTRAMUSCULAR | Status: AC
  Filled 2022-03-25: qty 1

## 2022-03-25 MED ORDER — DEXAMETHASONE SODIUM PHOSPHATE 10 MG/ML IJ SOLN
INTRAMUSCULAR | Status: DC | PRN
  Administered 2022-03-25: 10 mg via INTRAVENOUS

## 2022-03-25 MED ORDER — WATER FOR IRRIGATION, STERILE IR SOLN
Status: DC | PRN
  Administered 2022-03-25: 2000 mL

## 2022-03-25 MED ORDER — HYDROCODONE-ACETAMINOPHEN 7.5-325 MG PO TABS: 1.0000 | ORAL_TABLET | ORAL | Status: DC | PRN

## 2022-03-25 MED ORDER — LACTATED RINGERS IV SOLN: INTRAVENOUS | Status: DC

## 2022-03-25 MED ORDER — CELECOXIB 200 MG PO CAPS: 200.0000 mg | ORAL_CAPSULE | Freq: Two times a day (BID) | ORAL | Status: DC

## 2022-03-25 MED ORDER — DOCUSATE SODIUM 100 MG PO CAPS: 100.0000 mg | ORAL_CAPSULE | Freq: Two times a day (BID) | ORAL | 1 refills | Status: DC

## 2022-03-25 MED ORDER — PHENYLEPHRINE HCL-NACL 20-0.9 MG/250ML-% IV SOLN
INTRAVENOUS | Status: DC | PRN
  Administered 2022-03-25: 25 ug/min via INTRAVENOUS

## 2022-03-25 MED ORDER — BUPIVACAINE-EPINEPHRINE (PF) 0.25% -1:200000 IJ SOLN
INTRAMUSCULAR | Status: AC
  Filled 2022-03-25: qty 30

## 2022-03-25 MED ORDER — ACETAMINOPHEN 500 MG PO TABS
1000.0000 mg | ORAL_TABLET | Freq: Once | ORAL | Status: AC
  Administered 2022-03-25: 1000 mg via ORAL
  Filled 2022-03-25: qty 2

## 2022-03-25 MED ORDER — MIDAZOLAM HCL 2 MG/2ML IJ SOLN
INTRAMUSCULAR | Status: AC
  Filled 2022-03-25: qty 2

## 2022-03-25 MED ORDER — ONDANSETRON HCL 4 MG/2ML IJ SOLN
INTRAMUSCULAR | Status: AC
  Administered 2022-03-25: 2 mg
  Filled 2022-03-25: qty 2

## 2022-03-25 MED ORDER — LIDOCAINE HCL (PF) 2 % IJ SOLN
INTRAMUSCULAR | Status: DC | PRN
  Administered 2022-03-25: 50 mg via INTRADERMAL

## 2022-03-25 MED ORDER — ONDANSETRON HCL 4 MG/2ML IJ SOLN
INTRAMUSCULAR | Status: AC
  Filled 2022-03-25: qty 2

## 2022-03-25 MED ORDER — KETOROLAC TROMETHAMINE 30 MG/ML IJ SOLN
INTRAMUSCULAR | Status: AC
  Filled 2022-03-25: qty 1

## 2022-03-25 MED ORDER — BUPIVACAINE IN DEXTROSE 0.75-8.25 % IT SOLN
INTRATHECAL | Status: DC | PRN
  Administered 2022-03-25: 1.6 mL via INTRATHECAL

## 2022-03-25 MED ORDER — PROPOFOL 500 MG/50ML IV EMUL
INTRAVENOUS | Status: DC | PRN
  Administered 2022-03-25: 70 ug/kg/min via INTRAVENOUS

## 2022-03-25 MED ORDER — PHENYLEPHRINE HCL (PRESSORS) 10 MG/ML IV SOLN
INTRAVENOUS | Status: AC
  Filled 2022-03-25: qty 1

## 2022-03-25 MED ORDER — LACTATED RINGERS IV BOLUS: 250.0000 mL | Freq: Once | INTRAVENOUS | Status: DC

## 2022-03-25 MED ORDER — ONDANSETRON HCL 4 MG/2ML IJ SOLN: 4.0000 mg | Freq: Once | INTRAMUSCULAR | Status: DC | PRN

## 2022-03-25 MED ORDER — KETOROLAC TROMETHAMINE 30 MG/ML IJ SOLN
INTRAMUSCULAR | Status: DC | PRN
  Administered 2022-03-25: 30 mg via INTRAMUSCULAR

## 2022-03-25 MED ORDER — CHLORHEXIDINE GLUCONATE 0.12 % MT SOLN
15.0000 mL | Freq: Once | OROMUCOSAL | Status: AC
  Administered 2022-03-25: 15 mL via OROMUCOSAL

## 2022-03-25 MED ORDER — SODIUM CHLORIDE (PF) 0.9 % IJ SOLN
INTRAMUSCULAR | Status: AC
  Filled 2022-03-25: qty 30

## 2022-03-25 MED ORDER — SODIUM CHLORIDE (PF) 0.9 % IJ SOLN
INTRAMUSCULAR | Status: DC | PRN
  Administered 2022-03-25: 30 mL via INTRAVENOUS

## 2022-03-25 MED ORDER — ONDANSETRON HCL 4 MG PO TABS: 4.0000 mg | ORAL_TABLET | Freq: Three times a day (TID) | ORAL | 0 refills | Status: DC | PRN

## 2022-03-25 MED ORDER — CEFAZOLIN SODIUM-DEXTROSE 2-4 GM/100ML-% IV SOLN
2.0000 g | INTRAVENOUS | Status: AC
  Administered 2022-03-25: 2 g via INTRAVENOUS
  Filled 2022-03-25: qty 100

## 2022-03-25 MED ORDER — BUPIVACAINE-EPINEPHRINE 0.25% -1:200000 IJ SOLN
INTRAMUSCULAR | Status: DC | PRN
  Administered 2022-03-25: 30 mL

## 2022-03-25 MED ORDER — METHOCARBAMOL 500 MG IVPB - SIMPLE MED
INTRAVENOUS | Status: AC
  Filled 2022-03-25: qty 55

## 2022-03-25 MED ORDER — TRANEXAMIC ACID-NACL 1000-0.7 MG/100ML-% IV SOLN
1000.0000 mg | INTRAVENOUS | Status: AC
  Administered 2022-03-25: 1000 mg via INTRAVENOUS
  Filled 2022-03-25: qty 100

## 2022-03-25 MED ORDER — POVIDONE-IODINE 10 % EX SWAB
2.0000 | Freq: Once | CUTANEOUS | Status: AC
  Administered 2022-03-25: 2 via TOPICAL

## 2022-03-25 MED ORDER — POLYETHYLENE GLYCOL 3350 17 G PO PACK: 17.0000 g | PACK | Freq: Every day | ORAL | 0 refills | Status: DC | PRN

## 2022-03-25 MED ORDER — HYDROCODONE-ACETAMINOPHEN 5-325 MG PO TABS: 1.0000 | ORAL_TABLET | ORAL | Status: DC | PRN

## 2022-03-25 MED ORDER — LIDOCAINE HCL (PF) 2 % IJ SOLN
INTRAMUSCULAR | Status: AC
  Filled 2022-03-25: qty 5

## 2022-03-25 MED ORDER — HYDROMORPHONE HCL 1 MG/ML IJ SOLN
0.2500 mg | INTRAMUSCULAR | Status: DC | PRN
  Administered 2022-03-25 (×2): 0.5 mg via INTRAVENOUS

## 2022-03-25 MED ORDER — METOCLOPRAMIDE HCL 5 MG/ML IJ SOLN: 5.0000 mg | Freq: Three times a day (TID) | INTRAMUSCULAR | Status: DC | PRN

## 2022-03-25 MED ORDER — METHOCARBAMOL 500 MG PO TABS: 500.0000 mg | ORAL_TABLET | Freq: Four times a day (QID) | ORAL | Status: DC | PRN

## 2022-03-25 MED ORDER — HYDROCODONE-ACETAMINOPHEN 5-325 MG PO TABS: 1.0000 | ORAL_TABLET | ORAL | 0 refills | Status: DC | PRN

## 2022-03-25 MED ORDER — APIXABAN 2.5 MG PO TABS: 2.5000 mg | ORAL_TABLET | Freq: Two times a day (BID) | ORAL | 0 refills | Status: DC

## 2022-03-25 MED ORDER — PROPOFOL 10 MG/ML IV BOLUS
INTRAVENOUS | Status: DC | PRN
  Administered 2022-03-25: 30 mg via INTRAVENOUS
  Administered 2022-03-25: 20 mg via INTRAVENOUS

## 2022-03-25 MED ORDER — SODIUM CHLORIDE 0.9 % IR SOLN
Status: DC | PRN
  Administered 2022-03-25: 1000 mL

## 2022-03-25 MED ORDER — OXYCODONE HCL 5 MG PO TABS
ORAL_TABLET | ORAL | Status: AC
  Filled 2022-03-25: qty 1

## 2022-03-25 MED ORDER — METOCLOPRAMIDE HCL 5 MG PO TABS: 5.0000 mg | ORAL_TABLET | Freq: Three times a day (TID) | ORAL | Status: DC | PRN

## 2022-03-25 MED ORDER — MIDAZOLAM HCL 5 MG/5ML IJ SOLN
INTRAMUSCULAR | Status: DC | PRN
  Administered 2022-03-25 (×2): 1 mg via INTRAVENOUS

## 2022-03-25 MED ORDER — SENNA 8.6 MG PO TABS: 2.0000 | ORAL_TABLET | Freq: Every day | ORAL | 1 refills | Status: DC

## 2022-03-25 MED ORDER — ONDANSETRON HCL 4 MG/2ML IJ SOLN
INTRAMUSCULAR | Status: DC | PRN
  Administered 2022-03-25: 4 mg via INTRAVENOUS

## 2022-03-25 MED ORDER — ISOPROPYL ALCOHOL 70 % SOLN
Status: DC | PRN
  Administered 2022-03-25: 1 via TOPICAL

## 2022-03-25 MED ORDER — OXYCODONE HCL 5 MG/5ML PO SOLN: 5.0000 mg | Freq: Once | ORAL | Status: AC | PRN

## 2022-03-25 MED ORDER — MORPHINE SULFATE (PF) 2 MG/ML IV SOLN: 0.5000 mg | INTRAVENOUS | Status: DC | PRN

## 2022-03-25 MED ORDER — POVIDONE-IODINE 10 % EX SWAB: 2.0000 | Freq: Once | CUTANEOUS | Status: AC

## 2022-03-25 MED ORDER — ACETAMINOPHEN 325 MG PO TABS: 325.0000 mg | ORAL_TABLET | Freq: Four times a day (QID) | ORAL | Status: DC | PRN

## 2022-03-25 MED ORDER — ORAL CARE MOUTH RINSE: 15.0000 mL | Freq: Once | OROMUCOSAL | Status: AC

## 2022-03-25 MED ORDER — ONDANSETRON HCL 4 MG/2ML IJ SOLN: 4.0000 mg | Freq: Four times a day (QID) | INTRAMUSCULAR | Status: DC | PRN

## 2022-03-25 SURGICAL SUPPLY — 58 items
ACE SHELL 54 4H HIP (Shell) ×1 IMPLANT
ADH SKN CLS APL DERMABOND .7 (GAUZE/BANDAGES/DRESSINGS) ×1
APL PRP STRL LF DISP 70% ISPRP (MISCELLANEOUS) ×1
BAG COUNTER SPONGE SURGICOUNT (BAG) IMPLANT
BAG DECANTER FOR FLEXI CONT (MISCELLANEOUS) IMPLANT
BAG SPEC THK2 15X12 ZIP CLS (MISCELLANEOUS)
BAG SPNG CNTER NS LX DISP (BAG)
BAG ZIPLOCK 12X15 (MISCELLANEOUS) IMPLANT
CHLORAPREP W/TINT 26 (MISCELLANEOUS) ×1 IMPLANT
COVER PERINEAL POST (MISCELLANEOUS) ×1 IMPLANT
COVER SURGICAL LIGHT HANDLE (MISCELLANEOUS) ×1 IMPLANT
DERMABOND ADVANCED .7 DNX12 (GAUZE/BANDAGES/DRESSINGS) ×2 IMPLANT
DRAPE IMP U-DRAPE 54X76 (DRAPES) ×1 IMPLANT
DRAPE SHEET LG 3/4 BI-LAMINATE (DRAPES) ×3 IMPLANT
DRAPE STERI IOBAN 125X83 (DRAPES) ×1 IMPLANT
DRAPE U-SHAPE 47X51 STRL (DRAPES) ×2 IMPLANT
DRSG AQUACEL AG ADV 3.5X10 (GAUZE/BANDAGES/DRESSINGS) ×1 IMPLANT
ELECT REM PT RETURN 15FT ADLT (MISCELLANEOUS) ×1 IMPLANT
GAUZE SPONGE 4X4 12PLY STRL (GAUZE/BANDAGES/DRESSINGS) ×1 IMPLANT
GLOVE BIO SURGEON STRL SZ7 (GLOVE) ×1 IMPLANT
GLOVE BIO SURGEON STRL SZ8.5 (GLOVE) ×2 IMPLANT
GLOVE BIOGEL PI IND STRL 7.5 (GLOVE) ×1 IMPLANT
GLOVE BIOGEL PI IND STRL 8.5 (GLOVE) ×1 IMPLANT
GOWN SPEC L3 XXLG W/TWL (GOWN DISPOSABLE) ×1 IMPLANT
GOWN STRL REUS W/ TWL XL LVL3 (GOWN DISPOSABLE) ×1 IMPLANT
GOWN STRL REUS W/TWL XL LVL3 (GOWN DISPOSABLE) ×1
HANDPIECE INTERPULSE COAX TIP (DISPOSABLE) ×1
HEAD FEM -3XOFST 36XMDLR (Head) IMPLANT
HEAD MODULAR 36MM (Head) ×1 IMPLANT
HOLDER FOLEY CATH W/STRAP (MISCELLANEOUS) ×1 IMPLANT
HOOD PEEL AWAY T7 (MISCELLANEOUS) ×3 IMPLANT
KIT TURNOVER KIT A (KITS) IMPLANT
LINER ACETAB NN G7 F 36 (Liner) IMPLANT
MANIFOLD NEPTUNE II (INSTRUMENTS) ×1 IMPLANT
MARKER SKIN DUAL TIP RULER LAB (MISCELLANEOUS) ×1 IMPLANT
NDL SAFETY ECLIP 18X1.5 (MISCELLANEOUS) ×1 IMPLANT
NDL SPNL 18GX3.5 QUINCKE PK (NEEDLE) ×1 IMPLANT
NEEDLE SPNL 18GX3.5 QUINCKE PK (NEEDLE) ×1 IMPLANT
PACK ANTERIOR HIP CUSTOM (KITS) ×1 IMPLANT
PENCIL SMOKE EVACUATOR (MISCELLANEOUS) IMPLANT
SAW OSC TIP CART 19.5X105X1.3 (SAW) ×1 IMPLANT
SEALER BIPOLAR AQUA 6.0 (INSTRUMENTS) ×1 IMPLANT
SET HNDPC FAN SPRY TIP SCT (DISPOSABLE) ×1 IMPLANT
SHELL ACETAB 54 4H HIP (Shell) IMPLANT
SOLUTION PRONTOSAN WOUND 350ML (IRRIGATION / IRRIGATOR) ×1 IMPLANT
SPIKE FLUID TRANSFER (MISCELLANEOUS) ×1 IMPLANT
STEM FEMORAL TAPERLOC 13X111 (Stem) IMPLANT
SUT MNCRL AB 3-0 PS2 18 (SUTURE) ×1 IMPLANT
SUT MON AB 2-0 CT1 36 (SUTURE) ×1 IMPLANT
SUT STRATAFIX PDO 1 14 VIOLET (SUTURE) ×1
SUT STRATFX PDO 1 14 VIOLET (SUTURE) ×1
SUT VIC AB 2-0 CT1 27 (SUTURE) ×1
SUT VIC AB 2-0 CT1 TAPERPNT 27 (SUTURE) IMPLANT
SUTURE STRATFX PDO 1 14 VIOLET (SUTURE) ×1 IMPLANT
SYR 3ML LL SCALE MARK (SYRINGE) ×1 IMPLANT
TRAY FOLEY MTR SLVR 16FR STAT (SET/KITS/TRAYS/PACK) IMPLANT
TUBE SUCTION HIGH CAP CLEAR NV (SUCTIONS) ×1 IMPLANT
WATER STERILE IRR 1000ML POUR (IV SOLUTION) ×1 IMPLANT

## 2022-03-25 NOTE — Op Note (Signed)
OPERATIVE REPORT  SURGEON: Rod Can, MD   ASSISTANT: Larene Pickett, PA-C.  PREOPERATIVE DIAGNOSIS: Right hip arthritis.   POSTOPERATIVE DIAGNOSIS: Right hip arthritis.   PROCEDURE: Right total hip arthroplasty, anterior approach.   IMPLANTS: Biomet Taperloc Complete Microplasty stem, size 13 x 111 mm, high offset. Biomet G7 OsseoTi Cup, size 54 mm. Biomet Vivacit-E liner, size 36 mm, F, neutral. Biomet metal head ball, size 36 - 3 mm.  ANESTHESIA:  MAC and Spinal  ESTIMATED BLOOD LOSS:-150 mL    ANTIBIOTICS: 2 g Ancef.  DRAINS: None.  COMPLICATIONS: None.   CONDITION: PACU - hemodynamically stable.   BRIEF CLINICAL NOTE: Lauren [name] is a 77 y.o. female with a long-standing history of Right hip arthritis. After failing conservative management, the patient was indicated for total hip arthroplasty. The risks, benefits, and alternatives to the procedure were explained, and the patient elected to proceed.  PROCEDURE IN DETAIL: Surgical site was marked by myself in the pre-op holding area. Once inside the operating room, spinal anesthesia was obtained, and a foley catheter was inserted. The patient was then positioned on the Hana table.  All bony prominences were well padded.  The hip was prepped and draped in the normal sterile surgical fashion.  A time-out was called verifying side and site of surgery. The patient received IV antibiotics within 60 minutes of beginning the procedure.   Bikini incision was made, and superficial dissection was performed lateral to the ASIS. The direct anterior approach to the hip was performed through the Hueter interval.  Lateral femoral circumflex vessels were treated with the Auqumantys. The anterior capsule was exposed and an inverted T capsulotomy was made. The femoral neck cut was made to the level of the templated cut.  A corkscrew was placed into the head and the head was removed.  The femoral head was found to have eburnated bone.  The head was passed to the back table and was measured. Pubofemoral ligament was released off of the calcar, taking care to stay on bone. Superior capsule was released from the greater trochanter, taking care to stay lateral to the posterior border of the femoral neck in order to preserve the short external rotators.   Acetabular exposure was achieved, and the pulvinar and labrum were excised. Sequential reaming of the acetabulum was then performed up to a size 53 mm reamer. A 54 mm cup was then opened and impacted into place at approximately 40 degrees of abduction and 20 degrees of anteversion. The final polyethylene liner was impacted into place and acetabular osteophytes were removed.    I then gained femoral exposure taking care to protect the abductors and greater trochanter.  This was performed using standard external rotation, extension, and adduction.  A cookie cutter was used to enter the femoral canal, and then the femoral canal finder was placed.  Sequential broaching was performed up to a size 13.  Calcar planer was used on the femoral neck remnant.  I placed a high offset neck and a trial head ball.  The hip was reduced.  Leg lengths and offset were checked fluoroscopically.  The hip was dislocated and trial components were removed.  The final implants were placed, and the hip was reduced.  Fluoroscopy was used to confirm component position and leg lengths.  At 90 degrees of external rotation and full extension, the hip was stable to an anterior directed force.   The wound was copiously irrigated with Prontosan solution and normal saline using pulse lavage.  Marcaine solution was injected into the periarticular soft tissue.  The wound was closed in layers using #1 Vicryl and V-Loc for the fascia, 2-0 Vicryl for the subcutaneous fat, 2-0 Monocryl for the deep dermal layer, 3-0 running Monocryl subcuticular stitch, and Dermabond for the skin.  Once the glue was fully dried, an Aquacell Ag dressing  was applied.  The patient was transported to the recovery room in stable condition.  Sponge, needle, and instrument counts were correct at the end of the case x2.  The patient tolerated the procedure well and there were no known complications.  Please note that a surgical assistant was a medical necessity for this procedure to perform it in a safe and expeditious manner. Assistant was necessary to provide appropriate retraction of vital neurovascular structures, to prevent femoral fracture, and to allow for anatomic placement of the prosthesis.

## 2022-03-25 NOTE — Anesthesia Postprocedure Evaluation (Signed)
Anesthesia Post Note  Patient: Lauren [name]  Procedure(s) Performed: TOTAL HIP ARTHROPLASTY ANTERIOR APPROACH (Right: Hip)     Patient location during evaluation: PACU Anesthesia Type: Spinal Level of consciousness: oriented and awake and alert Pain management: pain level controlled Vital Signs Assessment: post-procedure vital signs reviewed and stable Respiratory status: spontaneous breathing, respiratory function stable and patient connected to nasal cannula oxygen Cardiovascular status: blood pressure returned to baseline and stable Postop Assessment: no headache, no backache and no apparent nausea or vomiting Anesthetic complications: no  No notable events documented.  Last Vitals:  Vitals:   03/25/22 1100 03/25/22 1115  BP: 106/66 114/69  Pulse: (!) 57 (!) 56  Resp: 10 10  Temp:    SpO2: 93% 97%    Last Pain:  Vitals:   03/25/22 1051  TempSrc:   PainSc: 7                  Audra Bellard S

## 2022-03-25 NOTE — Transfer of Care (Signed)
Immediate Anesthesia Transfer of Care Note  Patient: Lauren [name]  Procedure(s) Performed: TOTAL HIP ARTHROPLASTY ANTERIOR APPROACH (Right: Hip)  Patient Location: PACU  Anesthesia Type:Spinal  Level of Consciousness: awake, alert , oriented, and patient cooperative  Airway & Oxygen Therapy: Patient Spontanous Breathing and Patient connected to face mask oxygen  Post-op Assessment: Report given to RN and Post -op Vital signs reviewed and stable  Post vital signs: Reviewed and stable  Last Vitals:  Vitals Value Taken Time  BP 114/73 03/25/22 1020  Temp    Pulse 69 03/25/22 1023  Resp 17 03/25/22 1023  SpO2 87 % 03/25/22 1023  Vitals shown include unvalidated device data.  Last Pain:  Vitals:   03/25/22 0703  TempSrc: Oral  PainSc:       Patients Stated Pain Goal: 3 ([id number])  Complications: No notable events documented.

## 2022-03-25 NOTE — Interval H&P Note (Signed)
History and Physical Interval Note:  03/25/2022 8:16 AM  Lauren [name]  has presented today for surgery, with the diagnosis of Right hip osteoarthritis.  The various methods of treatment have been discussed with the patient and family. After consideration of risks, benefits and other options for treatment, the patient has consented to  Procedure(s): TOTAL HIP ARTHROPLASTY ANTERIOR APPROACH (Right) as a surgical intervention.  The patient's history has been reviewed, patient examined, no change in status, stable for surgery.  I have reviewed the patient's chart and labs.  Questions were answered to the patient's satisfaction.    The risks, benefits, and alternatives were discussed with the patient. There are risks associated with the surgery including, but not limited to, problems with anesthesia (death), infection, instability (giving out of the joint), dislocation, differences in leg length/angulation/rotation, fracture of bones, loosening or failure of implants, hematoma (blood accumulation) which may require surgical drainage, blood clots, pulmonary embolism, nerve injury (foot drop and lateral thigh numbness), and blood vessel injury. The patient understands these risks and elects to proceed.    Lauren [name]

## 2022-03-25 NOTE — Discharge Instructions (Signed)
? ?Dr. Brian Swinteck ?Joint Replacement Specialist ?[facility] ?[address] ?, [place] [address] ?[phone] ? ? ?TOTAL HIP REPLACEMENT POSTOPERATIVE DIRECTIONS ? ? ? ?Hip Rehabilitation, Guidelines Following Surgery  ? ?WEIGHT BEARING ?Weight bearing as tolerated with assist device (walker, cane, etc) as directed, use it as long as suggested by your surgeon or therapist, typically at least 4-6 weeks. ? ?The results of a hip operation are greatly improved after range of motion and muscle strengthening exercises. Follow all safety measures which are given to protect your hip. If any of these exercises cause increased pain or swelling in your joint, decrease the amount until you are comfortable again. Then slowly increase the exercises. Call your caregiver if you have problems or questions.  ? ?HOME CARE INSTRUCTIONS  ?Most of the following instructions are designed to prevent the dislocation of your new hip.  ?Remove items at home which could result in a fall. This includes throw rugs or furniture in walking pathways.  ?Continue medications as instructed at time of discharge. ?You may have some home medications which will be placed on hold until you complete the course of blood thinner medication. ?You may start showering once you are discharged home. Do not remove your dressing. ?Do not put on socks or shoes without following the instructions of your caregivers.   ?Sit on chairs with arms. Use the chair arms to help push yourself up when arising.  ?Arrange for the use of a toilet seat elevator so you are not sitting low.  ?Walk with walker as instructed.  ?You may resume a sexual relationship in one month or when given the OK by your caregiver.  ?Use walker as long as suggested by your caregivers.  ?You may put full weight on your legs and walk as much as is comfortable. ?Avoid periods of inactivity such as sitting longer than an hour when not asleep. This helps prevent blood  clots.  ?You may return to work once you are cleared by your surgeon.  ?Do not drive a car for 6 weeks or until released by your surgeon.  ?Do not drive while taking narcotics.  ?Wear elastic stockings for two weeks following surgery during the day but you may remove then at night.  ?Make sure you keep all of your appointments after your operation with all of your doctors and caregivers. You should call the office at the above phone number and make an appointment for approximately two weeks after the date of your surgery. ?Please pick up a stool softener and laxative for home use as long as you are requiring pain medications. ?ICE to the affected hip every three hours for 30 minutes at a time and then as needed for pain and swelling. Continue to use ice on the hip for pain and swelling from surgery. You may notice swelling that will progress down to the foot and ankle.  This is normal after surgery.  Elevate the leg when you are not up walking on it.   ?It is important for you to complete the blood thinner medication as prescribed by your doctor. ?Continue to use the breathing machine which will help keep your temperature down.  It is common for your temperature to cycle up and down following surgery, especially at night when you are not up moving around and exerting yourself.  The breathing machine keeps your lungs expanded and your temperature down. ? ?RANGE OF MOTION AND STRENGTHENING EXERCISES  ?These exercises are designed to help you   keep full movement of your hip joint. Follow your caregiver's or physical therapist's instructions. Perform all exercises about fifteen times, three times per day or as directed. Exercise both hips, even if you have had only one joint replacement. These exercises can be done on a training (exercise) mat, on the floor, on a table or on a bed. Use whatever works the best and is most comfortable for you. Use music or television while you are exercising so that the exercises are a  pleasant break in your day. This will make your life better with the exercises acting as a break in routine you can look forward to.  ?Lying on your back, slowly slide your foot toward your buttocks, raising your knee up off the floor. Then slowly slide your foot back down until your leg is straight again.  ?Lying on your back spread your legs as far apart as you can without causing discomfort.  ?Lying on your side, raise your upper leg and foot straight up from the floor as far as is comfortable. Slowly lower the leg and repeat.  ?Lying on your back, tighten up the muscle in the front of your thigh (quadriceps muscles). You can do this by keeping your leg straight and trying to raise your heel off the floor. This helps strengthen the largest muscle supporting your knee.  ?Lying on your back, tighten up the muscles of your buttocks both with the legs straight and with the knee bent at a comfortable angle while keeping your heel on the floor.  ? ?SKILLED REHAB INSTRUCTIONS: ?If the patient is transferred to a skilled rehab facility following release from the hospital, a list of the current medications will be sent to the facility for the patient to continue.  When discharged from the skilled rehab facility, please have the facility set up the patient's Home Health Physical Therapy prior to being released. Also, the skilled facility will be responsible for providing the patient with their medications at time of release from the facility to include their pain medication and their blood thinner medication. If the patient is still at the rehab facility at time of the two week follow up appointment, the skilled rehab facility will also need to assist the patient in arranging follow up appointment in our office and any transportation needs. ? ?POST-OPERATIVE OPIOID TAPER INSTRUCTIONS: ?It is important to wean off of your opioid medication as soon as possible. If you do not need pain medication after your surgery it is ok  to stop day one. ?Opioids include: ?Codeine, Hydrocodone(Norco, Vicodin), Oxycodone(Percocet, oxycontin) and hydromorphone amongst others.  ?Long term and even short term use of opiods can cause: ?Increased pain response ?Dependence ?Constipation ?Depression ?Respiratory depression ?And more.  ?Withdrawal symptoms can include ?Flu like symptoms ?Nausea, vomiting ?And more ?Techniques to manage these symptoms ?Hydrate well ?Eat regular healthy meals ?Stay active ?Use relaxation techniques(deep breathing, meditating, yoga) ?Do Not substitute Alcohol to help with tapering ?If you have been on opioids for less than two weeks and do not have pain than it is ok to stop all together.  ?Plan to wean off of opioids ?This plan should start within one week post op of your joint replacement. ?Maintain the same interval or time between taking each dose and first decrease the dose.  ?Cut the total daily intake of opioids by one tablet each day ?Next start to increase the time between doses. ?The last dose that should be eliminated is the evening dose.  ? ? ?MAKE   SURE YOU:  ?Understand these instructions.  ?Will watch your condition.  ?Will get help right away if you are not doing well or get worse. ? ?Pick up stool softner and laxative for home use following surgery while on pain medications. ?Do not remove your dressing. ?The dressing is waterproof--it is OK to take showers. ?Continue to use ice for pain and swelling after surgery. ?Do not use any lotions or creams on the incision until instructed by your surgeon. ?Total Hip Protocol. ? ?

## 2022-03-25 NOTE — Anesthesia Preprocedure Evaluation (Signed)
Anesthesia Evaluation  Patient identified by MRN, date of birth, ID band Patient awake    Reviewed: Allergy & Precautions, H&P , NPO status , Patient's Chart, lab work & pertinent test results  Airway Mallampati: II  TM Distance: >3 FB Neck ROM: Full    Dental no notable dental hx.    Pulmonary neg pulmonary ROS, former smoker   Pulmonary exam normal breath sounds clear to auscultation       Cardiovascular negative cardio ROS Normal cardiovascular exam Rhythm:Regular Rate:Normal     Neuro/Psych negative neurological ROS  negative psych ROS   GI/Hepatic negative GI ROS, Neg liver ROS,,,  Endo/Other  Hypothyroidism    Renal/GU negative Renal ROS  negative genitourinary   Musculoskeletal  (+) Arthritis , Osteoarthritis,    Abdominal   Peds negative pediatric ROS (+)  Hematology negative hematology ROS (+)   Anesthesia Other Findings   Reproductive/Obstetrics negative OB ROS                             Anesthesia Physical Anesthesia Plan  ASA: 2  Anesthesia Plan: Spinal   Post-op Pain Management: Regional block*   Induction: Intravenous  PONV Risk Score and Plan: 2 and Ondansetron, Dexamethasone and Treatment may vary due to age or medical condition  Airway Management Planned: Simple Face Mask  Additional Equipment:   Intra-op Plan:   Post-operative Plan:   Informed Consent: I have reviewed the patients History and Physical, chart, labs and discussed the procedure including the risks, benefits and alternatives for the proposed anesthesia with the patient or authorized representative who has indicated his/her understanding and acceptance.     Dental advisory given  Plan Discussed with: CRNA and Surgeon  Anesthesia Plan Comments:        Anesthesia Quick Evaluation

## 2022-03-25 NOTE — Anesthesia Procedure Notes (Signed)
Spinal  Patient location during procedure: OR Start time: 03/25/2022 8:33 AM End time: 03/25/2022 8:38 AM Reason for block: surgical anesthesia Staffing Performed: anesthesiologist  Anesthesiologist: Myrtie Soman, MD Performed by: Myrtie Soman, MD Authorized by: Myrtie Soman, MD   Preanesthetic Checklist Completed: patient identified, IV checked, site marked, risks and benefits discussed, surgical consent, monitors and equipment checked, pre-op evaluation and timeout performed Spinal Block Patient position: sitting Prep: Betadine Patient monitoring: heart rate, continuous pulse ox and blood pressure Approach: midline Location: L3-4 Injection technique: single-shot Needle Needle type: Sprotte  Needle gauge: 24 G Needle length: 9 cm Assessment Sensory level: T6 Events: CSF return Additional Notes

## 2022-03-25 NOTE — Evaluation (Signed)
Physical Therapy Evaluation Patient Details Name: Lauren [name] MRN: [id number] DOB: November 29, 1945 Today's Date: 03/25/2022  History of Present Illness  Pt is 77 yo female presents to therapy s/p R THA, anterior approach on 03/25/2022 due to failure of conservative measures. Pt PMH includes but is not limited to: B BaCa, GERD, and hypothyroidism  Clinical Impression  Pt is s/p TKA resulting in the deficits listed below (see PT Problem List). At baseline, pt is independent and ambulatory.  She will have support at d/c and only has 4 steps to enter home with rails.  Today, pt with fair pain control and was able to ambulate 100' with RW and supervision.  She performed stairs similar to home set up safely.  Pt was provided HEP and written instructions on progression.   Pt demonstrates safe gait & transfers in order to return home from PT perspective once discharged by MD.  While in hospital, will continue to benefit from PT for skilled therapy to advance mobility and exercises.          Recommendations for follow up therapy are one component of a multi-disciplinary discharge planning process, led by the attending physician.  Recommendations may be updated based on patient status, additional functional criteria and insurance authorization.  Follow Up Recommendations Follow physician's recommendations for discharge plan and follow up therapies      Assistance Recommended at Discharge Intermittent Supervision/Assistance  Patient can return home with the following  A little help with walking and/or transfers;A little help with bathing/dressing/bathroom;Assistance with cooking/housework;Help with stairs or ramp for entrance    Equipment Recommendations Rolling walker (2 wheels) (Checked height pt borderline but regular walker works better than youth)  Recommendations for Other Services       Functional Status Assessment Patient has had a recent decline in their functional status and demonstrates the  ability to make significant improvements in function in a reasonable and predictable amount of time.     Precautions / Restrictions Precautions Precautions: Fall Restrictions Weight Bearing Restrictions: Yes RLE Weight Bearing: Weight bearing as tolerated      Mobility  Bed Mobility Overal bed mobility: Needs Assistance Bed Mobility: Supine to Sit, Sit to Supine     Supine to sit: Supervision Sit to supine: Supervision   General bed mobility comments: Educated on how to assist R LE if needed    Transfers Overall transfer level: Needs assistance Equipment used: Rolling walker (2 wheels) Transfers: Sit to/from Stand Sit to Stand: Min guard           General transfer comment: Performed from bed x 3 and toielt x 1; min guard safety; educated on hand placement    Ambulation/Gait Ambulation/Gait assistance: Min guard, Supervision Gait Distance (Feet): 80 Feet Assistive device: Rolling walker (2 wheels) Gait Pattern/deviations: Step-to pattern, Decreased stride length Gait velocity: decreased     General Gait Details: Started min guard progressed to supervision; cues for RW proximity initially with good carry over; steady with RW  Stairs Stairs: Yes Stairs assistance: Min guard Stair Management: Step to pattern, Forwards Number of Stairs: 5 General stair comments: Performed 2 w with bil rails and  min guard progressed to 3 with L rail and R HHA and min guard (spouse assisting)  Wheelchair Mobility    Modified Rankin (Stroke Patients Only)       Balance Overall balance assessment: Needs assistance Sitting-balance support: No upper extremity supported Sitting balance-Leahy Scale: Good     Standing balance support: Bilateral upper extremity supported,  No upper extremity supported Standing balance-Leahy Scale: Fair Standing balance comment: RW to ambulate - steady with RW; static stand without support                             Pertinent  Vitals/Pain Pain Assessment Pain Assessment: 0-10 Pain Score: 8  Pain Location: R thigh Pain Descriptors / Indicators: Discomfort Pain Intervention(s): Limited activity within patient's tolerance, Monitored during session, Repositioned    Home Living Family/patient expects to be discharged to:: Private residence Living Arrangements: Spouse/significant other Available Help at Discharge: Family;Available 24 hours/day Type of Home: House Home Access: Stairs to enter Entrance Stairs-Rails: Psychiatric nurse of Steps: 4   Home Layout: One level Home Equipment: Grab bars - tub/shower;Tub bench;Grab bars - toilet;Cane - single point      Prior Function Prior Level of Function : Independent/Modified Independent;Driving             Mobility Comments: Could ambulate in community ADLs Comments: independent adls and iadls     Hand Dominance        Extremity/Trunk Assessment   Upper Extremity Assessment Upper Extremity Assessment: Overall WFL for tasks assessed    Lower Extremity Assessment Lower Extremity Assessment: LLE deficits/detail;RLE deficits/detail RLE Deficits / Details: Expected post op changes; ROM WFL; MMT: ankle 5/5, knee 3/5, hip 2/5 LLE Deficits / Details: ROM WFL; MMT 5/5    Cervical / Trunk Assessment Cervical / Trunk Assessment: Normal  Communication   Communication: No difficulties  Cognition Arousal/Alertness: Awake/alert Behavior During Therapy: WFL for tasks assessed/performed Overall Cognitive Status: Within Functional Limits for tasks assessed                                          General Comments  Educated on safe ice use, no pivots, car transfers,  and TED hose during day. Also, encouraged walking every 1-2 hours during day. Educated on HEP with focus on mobility the first weeks. Discussed doing exercises within pain control and if pain increasing could decreased ROM, reps, and stop exercises as needed.  Encouraged to perform  ankle pumps frequently for blood flow.    Exercises Total Joint Exercises Ankle Circles/Pumps: AROM, Both, 10 reps, Supine Quad Sets: AROM, Both, 10 reps, Supine Heel Slides: Right, AAROM, 5 reps, Supine Hip ABduction/ADduction: AAROM, Right, 5 reps, Supine Long Arc Quad: AROM, Right, 5 reps, Seated Other Exercises Other Exercises: Performed 2 reps standing R leg hip flex, ext, abd, and HS curl with min guard and RW   Assessment/Plan    PT Assessment Patient needs continued PT services  PT Problem List Decreased strength;Pain;Decreased range of motion;Decreased activity tolerance;Decreased balance;Decreased mobility;Decreased knowledge of precautions;Decreased knowledge of use of DME       PT Treatment Interventions DME instruction;Therapeutic exercise;Gait training;Balance training;Stair training;Functional mobility training;Therapeutic activities;Patient/family education;Modalities    PT Goals (Current goals can be found in the Care Plan section)  Acute Rehab PT Goals Patient Stated Goal: return home PT Goal Formulation: With patient/family Time For Goal Achievement: 04/08/22 Potential to Achieve Goals: Good    Frequency 7X/week     Co-evaluation               AM-PAC PT "6 Clicks" Mobility  Outcome Measure Help needed turning from your back to your side while in a flat bed without using bedrails?: A Little  Help needed moving from lying on your back to sitting on the side of a flat bed without using bedrails?: A Little Help needed moving to and from a bed to a chair (including a wheelchair)?: A Little Help needed standing up from a chair using your arms (e.g., wheelchair or bedside chair)?: A Little Help needed to walk in hospital room?: A Little Help needed climbing 3-5 steps with a railing? : A Little 6 Click Score: 18    End of Session Equipment Utilized During Treatment: Gait belt Activity Tolerance: Patient tolerated treatment  well Patient left: in bed;with call bell/phone within reach;with family/visitor present Nurse Communication: Mobility status (passed therapy but unable to urinate) PT Visit Diagnosis: Other abnormalities of gait and mobility (R26.89);Muscle weakness (generalized) (M62.81)    Time: [id number] PT Time Calculation (min) (ACUTE ONLY): 37 min   Charges:   PT Evaluation $PT Eval Low Complexity: 1 Low PT Treatments $Gait Training: 8-22 mins        Abran Richard, PT Acute Rehab [facility] [phone]   Karlton Lemon 03/25/2022, 3:17 PM

## 2022-03-26 ENCOUNTER — Encounter (HOSPITAL_COMMUNITY): Payer: Self-pay | Admitting: Orthopedic Surgery

## 2022-03-27 ENCOUNTER — Telehealth: Payer: Self-pay | Admitting: Genetic Counselor

## 2022-03-27 NOTE — Telephone Encounter (Signed)
Per 3/15 IB reached out  to reschedule patients appointment , patient wants to cancel at this time.

## 2022-04-07 DIAGNOSIS — M1611 Unilateral primary osteoarthritis, right hip: Secondary | ICD-10-CM | POA: Diagnosis not present

## 2022-04-07 NOTE — Progress Notes (Incomplete)
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 BREAST, RIGHT, LUMPECTOMY: Invasive ductal carcinoma, 0.9 cm, grade 2 Ductal carcinoma in situ: Present, solid, intermediate nuclear grade Margins, invasive: Negative     Closest, invasive: 7 mm posterior Margins, DCIS: Negative     Closest, DCIS: 9 mm posterior Lymphovascular invasion: Not identified Prognostic markers: ER positive, PR positive HER2 negative Ki-67 1% Other: Fibrocystic changes with usual ductal hyperplasia See oncology table  B. BREAST, RIGHT MEDIAL MARGIN, EXCISION: Benign breast tissue Medial margin negative for carcinoma  C. BREAST, RIGHT INFERIOR MARGIN, EXCISION: Benign breast tissue Inferior margin negative for carcinoma  D. BREAST, RIGHT SUPERIOR MARGIN, EXCISION: Benign breast tissue Superior margin negative for carcinoma  ONCOLOGY TABLE: INVASIVE CARCINOMA OF THE BREAST:  Resection Procedure: Right breast lumpectomy with additional medial, inferior and superior margin biopsies Specimen Laterality: Right breast Histologic Type: Ductal Histologic Grade:      Glandular (Acinar)/Tubular Differentiation: 3      Nuclear Pleomorphism: 2      Mitotic Rate: 1      Overall Grade: 2 Tumor Size: 0.9 x 0.9 x 0.8 cm Ductal Carcinoma In Situ: Present, solid, intermediate nuclear grade Lymphatic and/or Vascular Invasion: Not identified Treatment Effect in the Breast: No known presurgical therapy Margins: All margins negative for invasive carcinoma      Distance from Closest Margin (mm): 7 mm      Specify Closest Margin (required only if <78mm): Posterior DCIS Margins: All margins negative for DCIS      Distance from Closest Margin (mm): 9 mm      Specify Closest Margin (required only if <29mm): Posterior Regional Lymph Nodes: No lymph nodes submitted Distant  Metastasis:      Distant Site(s) Involved: Not applicable Breast Biomarker Testing Performed on Previous Biopsy:      Testing Performed on Case Number: [id number]            Estrogen Receptor: 100%, positive, strong staining            Progesterone Receptor: 80%, positive, strong staining            HER2: Equivocal with IHC (2+).  FISH negative, ratio 1.16            Ki-67: 1% Pathologic Stage Classification (pTNM, AJCC 8th Edition): pT1b, pN not assigned Representative Tumor Block: A1-A3 (v4.5.0.0)  Receptor Status: ER(100%), PR (80%), Her2-neu (neg), Ki-67(1%)  Did patient present with symptoms (if so, please note symptoms) or was this found on screening mammography?: lump found by pt   Past/Anticipated interventions by surgeon, if any: 03-12-22 Procedure:  Right breast radioactive seed guided lumpectomy Surgeon: Dr. Serita Grammes  Past/Anticipated interventions by medical oncology, if any:  Dr. Marin Olp 03-17-22 Chief Complaint  Patient presents with   New Patient (Initial Visit)      Breast Cancer Right Breast.  : I have another breast cancer.   HPI: Ms. Bobier is well-known to me.  I have known her prior for about 25 years.  She is very nice 77 year old white female.  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  She underwent lumpectomy followed by chemotherapy and radiation therapy.  This was all completed back in 2004.   She was then found to have a lump in the right breast.  This was found on mammogram.  Subsequently, she underwent a lumpectomy  on 03/02/2022.  Dr. Donne Hazel did the surgery.  The pathology report ([id number]) showed a 9 mm infiltrating ductal carcinoma.  All margins were negative.  The tumor had a proliferation index of only 1%.  She had no lymphovascular invasion.  The tumor was ER positive/PR positive/HER2 negative.  Assessment and Plan:  Ms. Kubin is a very charming 77 year old postmenopausal white female.  She has a second  breast cancer.  This is a second primary for her.   She has a incredibly favorable breast cancer.  She has a very low Oncotype score.  She has a very small cancer.   She clearly does not need chemotherapy.  I would certainly put her on antiestrogen therapy.  I would put her on Femara.  However, I will wait until after she has her hip surgery and radiation.   She is already seen Dr. Eppie Gibson of Radiation Oncology.  I think that Ms.Willmann would be amenable to the very short course of radiotherapy.  I do not know where this would have to be scheduled.  I would think this would be scheduled after her hip surgery.   I think that her prognosis is incredibly good.  I think the risk of recurrence should be less than 5%.   I do not see a problem with her having hip surgery.   I would like to see her back in about 6 weeks.  By then, she should be through any radiation that she has.  She also should be able to come in after having the hip surgery.   Lymphedema issues, if any:  {:18581} {t:21944}   Pain issues, if any:  {:18581} {PAIN DESCRIPTION:21022940}  SAFETY ISSUES: Prior radiation? Yes, 2004-Breast Pacemaker/ICD? {:18581} Possible current pregnancy? no Is the patient on methotrexate? no  Current Complaints / other details:  ***

## 2022-04-09 ENCOUNTER — Other Ambulatory Visit: Payer: Medicare Other

## 2022-04-09 ENCOUNTER — Encounter: Payer: Medicare Other | Admitting: Genetic Counselor

## 2022-04-10 DIAGNOSIS — M25562 Pain in left knee: Secondary | ICD-10-CM | POA: Diagnosis not present

## 2022-04-20 NOTE — Progress Notes (Signed)
Radiation Oncology         [phone] ________________________________  Name: Lauren [name] MRN: [id number]  Date: 04/21/2022  DOB: 1945-10-01  Follow-Up Visit Note  Outpatient  CC: Carylon Perches, MD  Josph Macho, MD  Diagnosis:   No diagnosis found.   Stage IA Right Breast UOQ, Invasive Lobular Carcinoma, ER+ / PR+ / Her2-, Grade 2: s/p lumpectomy   History of Stage I (T1 N0 M0) lobular carcinoma of the left breast: s/p lumpectomy (performed in 1999), radiation, and tamoxifen x 5 years completed in 2004.     Cancer Staging  Malignant neoplasm of upper-outer quadrant of right breast in female, estrogen receptor positive Staging form: Breast, AJCC 8th Edition - Clinical stage from 02/17/2022: Stage IA (cT1b, cN0, cM0, G2, ER+, PR+, HER2-) - Signed by Lonie Peak, MD on 02/17/2022  Stage I breast cancer, left Staging form: Breast, AJCC 8th Edition - Clinical stage from 03/17/2022: Stage IA (cT1b, cN0, cM0, G1, ER+, PR+, HER2-) - Signed by Josph Macho, MD on 03/17/2022   CHIEF COMPLAINT: Here to discuss management of right breast cancer  Narrative:  The patient returns today for follow-up.     Since her consultation date of 02/17/22, the patient opted to proceed with a right breast lumpectomy without nodal biopsies on 03/02/22 under the care of Dr. Dwain Sarna. Pathology from the procedure revealed: tumor size of 0.9 cm; histology of grade 2 invasive ductal carcinoma with intermediate grade DCIS; all margins negative for both invasive and in situ disease; margin status to invasive disease of 7 mm from the posterior margin; margin status to in situ disease of 9 mm from the posterior margin;  ER status: 100% positive and PR status 80% positive, both with strong staining intensity; Proliferation marker Ki67 at 1%; Her2 status negative; Grade 2.  Oncotype DX was obtained on the final surgical sample and the recurrence score of 9 predicts a risk of recurrence outside the breast over the  next 9 years of 3%, if the patient's only systemic therapy were to be an antiestrogen for 5 years.  It also predicted no significant benefit from chemotherapy.  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   As indicated above, the patient will not require chemotherapy and she will follow up with Dr. Myna Hidalgo in the near future to discuss antiestrogen treatment options.   Symptomatically, the patient reports: ***  PREVIOUS RADIATION THERAPY: Yes   History of Stage I (T1 N0 M0) lobular carcinoma of the left breast: s/p lumpectomy (performed in 1999), radiation, and tamoxifen x 5 years completed in 2004.          ALLERGIES:  has No Known Allergies.  Meds: Current Outpatient Medications  Medication Sig Dispense Refill   acetaminophen (TYLENOL) 500 MG tablet Take 1,000 mg by mouth every 6 (six) hours as needed for moderate pain.     apixaban (ELIQUIS) 2.5 MG TABS tablet Take 1 tablet (2.5 mg total) by mouth 2 (two) times daily. 60 tablet 0   Calcium Carb-Cholecalciferol (CALCIUM 600+D) 600-800 MG-UNIT TABS Take 1 tablet by mouth every evening.     Cyanocobalamin (VITAMIN B-12) 2500 MCG SUBL Take 2,500 mcg by mouth every evening.     diclofenac Sodium (VOLTAREN) 1 % GEL Apply 1 Application topically 2 (two) times daily as needed (joint pain).     docusate sodium (COLACE) 100 MG capsule Take 1 capsule (100 mg total) by mouth 2 (two) times daily.  60 capsule 1   Glucosamine-Chondroitin (COSAMIN DS PO) Take 1 tablet by mouth in the morning and at bedtime. 1500-1200     HYDROcodone-acetaminophen (NORCO) 5-325 MG tablet Take 1 tablet by mouth every 4 (four) hours as needed for moderate pain. 40 tablet 0   latanoprost (XALATAN) 0.005 % ophthalmic solution Place 1 drop into both eyes at bedtime.     Magnesium 500 MG TABS Take 500 mg by mouth daily in the afternoon.     ondansetron (ZOFRAN) 4 MG tablet Take 1 tablet (4 mg total) by mouth every 8 (eight) hours as  needed for nausea or vomiting. 20 tablet 0   polyethylene glycol (MIRALAX) 17 g packet Take 17 g by mouth daily as needed for moderate constipation or severe constipation. 14 each 0   senna (SENOKOT) 8.6 MG TABS tablet Take 2 tablets (17.2 mg total) by mouth at bedtime. 60 tablet 1   SYNTHROID 112 MCG tablet Take 112 mcg by mouth daily before breakfast.     timolol (TIMOPTIC) 0.5 % ophthalmic solution Place 1 drop into both eyes daily.     No current facility-administered medications for this encounter.    Physical Findings:  vitals were not taken for this visit. .     General: Alert and oriented, in no acute distress HEENT: Head is normocephalic. Extraocular movements are intact. Oropharynx is clear. Neck: Neck is supple, no palpable cervical or supraclavicular lymphadenopathy. Heart: Regular in rate and rhythm with no murmurs, rubs, or gallops. Chest: Clear to auscultation bilaterally, with no rhonchi, wheezes, or rales. Abdomen: Soft, nontender, nondistended, with no rigidity or guarding. Extremities: No cyanosis or edema. Lymphatics: see Neck Exam Musculoskeletal: symmetric strength and muscle tone throughout. Neurologic: No obvious focalities. Speech is fluent.  Psychiatric: Judgment and insight are intact. Affect is appropriate. Breast exam reveals ***  Lab Findings: Lab Results  Component Value Date   WBC 8.4 03/16/2022   HGB 13.9 03/16/2022   HCT 42.7 03/16/2022   MCV 93.4 03/16/2022   PLT 271 03/16/2022    @  Radiographic Findings: DG Pelvis Portable  Result Date: 03/25/2022 CLINICAL DATA:  Postop check EXAM: PORTABLE PELVIS 1-2 VIEWS COMPARISON:  None Available. FINDINGS: Osseous demineralization. RIGHT hip prosthesis without fracture or dislocation. Pelvis intact. IMPRESSION: RIGHT hip prosthesis without acute complication. Electronically Signed   By: Ulyses Southward M.D.   On: 03/25/2022 12:02   DG HIP UNILAT WITH PELVIS 1V RIGHT  Result Date:  03/25/2022 CLINICAL DATA:  Intraoperative fluoroscopy total right hip arthroplasty. EXAM: DG HIP (WITH OR WITHOUT PELVIS) 1V RIGHT COMPARISON:  None Available. FINDINGS: Images were performed intraoperatively without the presence of a radiologist. Severe right femoroacetabular joint space narrowing and peripheral osteophytosis. The patient subsequently is undergoing total right hip arthroplasty. No hardware complication is seen. Total fluoroscopy images: 8 Total fluoroscopy time: 8 seconds Total dose: Radiation Exposure Index (as provided by the fluoroscopic device): 0.91 mGy air Kerma Please see intraoperative findings for further detail. IMPRESSION: Intraoperative fluoroscopy for total right hip arthroplasty. Electronically Signed   By: Neita Garnet M.D.   On: 03/25/2022 10:06   DG C-Arm 1-60 Min-No Report  Result Date: 03/25/2022 Fluoroscopy was utilized by the requesting physician.  No radiographic interpretation.   DG C-Arm 1-60 Min-No Report  Result Date: 03/25/2022 Fluoroscopy was utilized by the requesting physician.  No radiographic interpretation.    Impression/Plan: We discussed adjuvant radiotherapy today.  I recommend *** in order to ***.  I reviewed the logistics, benefits,  risks, and potential side effects of this treatment in detail. Risks may include but not necessary be limited to acute and late injury tissue in the radiation fields such as skin irritation (change in color/pigmentation, itching, dryness, pain, peeling). She may experience fatigue. We also discussed possible risk of long term cosmetic changes or scar tissue. There is also a smaller risk for lung toxicity, ***cardiac toxicity, ***brachial plexopathy, ***lymphedema, ***musculoskeletal changes, ***rib fragility or ***induction of a second malignancy, ***late chronic non-healing soft tissue wound.    The patient asked good questions which I answered to her satisfaction. She is enthusiastic about proceeding with treatment.  A consent form has been *** signed and placed in her chart.  A total of *** medically necessary complex treatment devices will be fabricated and supervised by me: *** fields with MLCs for custom blocks to protect heart, and lungs;  and, a Vac-lok. MORE COMPLEX DEVICES MAY BE MADE IN DOSIMETRY FOR FIELD IN FIELD BEAMS FOR DOSE HOMOGENEITY.  I have requested : 3D Simulation which is medically necessary to give adequate dose to at risk tissues while sparing lungs and heart.  I have requested a DVH of the following structures: lungs, heart, *** lumpectomy cavity.    The patient will receive *** Gy in *** fractions to the *** with *** fields.  This will be *** followed by a boost.  On date of service, in total, I spent *** minutes on this encounter. Patient was seen in person.  _____________________________________   Lonie PeakSarah Jermar Colter, MD  This document serves as a record of services personally performed by Lonie PeakSarah Aerielle Stoklosa, MD. It was created on her behalf by Neena RhymesElisa Frazier, a trained medical scribe. The creation of this record is based on the scribe's personal observations and the provider's statements to them. This document has been checked and approved by the attending provider.

## 2022-04-21 ENCOUNTER — Ambulatory Visit
Admission: RE | Admit: 2022-04-21 | Discharge: 2022-04-21 | Disposition: A | Discharge status: Home / Self Care | Payer: Medicare Other | Source: Ambulatory Visit | Attending: Radiation Oncology | Admitting: Radiation Oncology

## 2022-04-21 ENCOUNTER — Other Ambulatory Visit: Payer: Self-pay

## 2022-04-21 ENCOUNTER — Encounter: Payer: Self-pay | Admitting: Radiation Oncology

## 2022-04-21 VITALS — BP 123/67 | HR 77 | Temp 97.4°F | Resp 18 | Ht 61.0 in | Wt 165.4 lb

## 2022-04-21 DIAGNOSIS — Z79899 Other long term (current) drug therapy: Secondary | ICD-10-CM | POA: Diagnosis not present

## 2022-04-21 DIAGNOSIS — C50411 Malignant neoplasm of upper-outer quadrant of right female breast: Secondary | ICD-10-CM | POA: Diagnosis not present

## 2022-04-21 DIAGNOSIS — Z7901 Long term (current) use of anticoagulants: Secondary | ICD-10-CM | POA: Insufficient documentation

## 2022-04-21 DIAGNOSIS — Z510 Encounter for antineoplastic radiation therapy: Secondary | ICD-10-CM | POA: Insufficient documentation

## 2022-04-21 DIAGNOSIS — Z170 Estrogen receptor positive status [ER+]: Secondary | ICD-10-CM | POA: Diagnosis not present

## 2022-04-22 ENCOUNTER — Encounter: Payer: Self-pay | Admitting: *Deleted

## 2022-04-22 ENCOUNTER — Inpatient Hospital Stay: Payer: Medicare Other | Attending: Hematology & Oncology

## 2022-04-22 ENCOUNTER — Inpatient Hospital Stay (HOSPITAL_BASED_OUTPATIENT_CLINIC_OR_DEPARTMENT_OTHER): Payer: Medicare Other | Admitting: Family

## 2022-04-22 ENCOUNTER — Encounter: Payer: Self-pay | Admitting: Family

## 2022-04-22 VITALS — BP 128/77 | HR 79 | Temp 98.7°F | Resp 17 | Wt 166.1 lb

## 2022-04-22 DIAGNOSIS — Z170 Estrogen receptor positive status [ER+]: Secondary | ICD-10-CM | POA: Insufficient documentation

## 2022-04-22 DIAGNOSIS — Z853 Personal history of malignant neoplasm of breast: Secondary | ICD-10-CM | POA: Insufficient documentation

## 2022-04-22 DIAGNOSIS — C50912 Malignant neoplasm of unspecified site of left female breast: Secondary | ICD-10-CM

## 2022-04-22 DIAGNOSIS — C50911 Malignant neoplasm of unspecified site of right female breast: Secondary | ICD-10-CM | POA: Insufficient documentation

## 2022-04-22 LAB — CBC WITH DIFFERENTIAL (CANCER CENTER ONLY)
Abs Immature Granulocytes: 0.05 10*3/uL (ref 0.00–0.07)
Basophils Absolute: 0.1 10*3/uL (ref 0.0–0.1)
Basophils Relative: 1 %
Eosinophils Absolute: 0.1 10*3/uL (ref 0.0–0.5)
Eosinophils Relative: 1 %
HCT: 41.4 % (ref 36.0–46.0)
Hemoglobin: 13.3 g/dL (ref 12.0–15.0)
Immature Granulocytes: 1 %
Lymphocytes Relative: 25 %
Lymphs Abs: 2.3 10*3/uL (ref 0.7–4.0)
MCH: 29.8 pg (ref 26.0–34.0)
MCHC: 32.1 g/dL (ref 30.0–36.0)
MCV: 92.8 fL (ref 80.0–100.0)
Monocytes Absolute: 1.1 10*3/uL — ABNORMAL HIGH (ref 0.1–1.0)
Monocytes Relative: 12 %
Neutro Abs: 5.6 10*3/uL (ref 1.7–7.7)
Neutrophils Relative %: 60 %
Platelet Count: 315 10*3/uL (ref 150–400)
RBC: 4.46 MIL/uL (ref 3.87–5.11)
RDW: 13 % (ref 11.5–15.5)
WBC Count: 9.1 10*3/uL (ref 4.0–10.5)
nRBC: 0 % (ref 0.0–0.2)

## 2022-04-22 LAB — CMP (CANCER CENTER ONLY)
ALT: 12 U/L (ref 0–44)
AST: 14 U/L — ABNORMAL LOW (ref 15–41)
Albumin: 4.1 g/dL (ref 3.5–5.0)
Alkaline Phosphatase: 116 U/L (ref 38–126)
Anion gap: 7 (ref 5–15)
BUN: 9 mg/dL (ref 8–23)
CO2: 29 mmol/L (ref 22–32)
Calcium: 9.1 mg/dL (ref 8.9–10.3)
Chloride: 100 mmol/L (ref 98–111)
Creatinine: 0.55 mg/dL (ref 0.44–1.00)
GFR, Estimated: >60 mL/min (ref >60)
Glucose, Bld: 87 mg/dL (ref 70–99)
Potassium: 5.2 mmol/L — ABNORMAL HIGH (ref 3.5–5.1)
Sodium: 136 mmol/L (ref 135–145)
Total Bilirubin: 0.3 mg/dL (ref 0.3–1.2)
Total Protein: 7.1 g/dL (ref 6.5–8.1)

## 2022-04-22 LAB — VITAMIN D 25 HYDROXY (VIT D DEFICIENCY, FRACTURES): Vit D, 25-Hydroxy: 50.76 ng/mL (ref 30–100)

## 2022-04-22 NOTE — Progress Notes (Signed)
Patient comes in after surgery on her hip. She will start radiation next week. Once radiation is complete she will proceed onto AI.   Oncology Nurse Navigator Documentation     04/22/2022    3:00 PM  Oncology Nurse Navigator Flowsheets  Phase of Treatment Radiation  Radiation Actual Start Date: 04/27/2022  Radiation Expected End Date: 05/15/2022  Navigator Follow Up Date: 05/19/2022  Navigator Follow Up Reason: Follow-up Appointment  Navigator Location [facility]  Navigator Encounter Type Appt/Treatment Plan Review  Patient Visit Type MedOnc  Treatment Phase Active Tx  Barriers/Navigation Needs Coordination of Care;Education  Interventions None Required  Acuity Level 2-Minimal Needs (1-2 Barriers Identified)  Support Groups/Services Friends and Family  Time Spent with Patient 15

## 2022-04-22 NOTE — Progress Notes (Signed)
Hematology and Oncology Follow Up Visit  [name] [id number] 14-Jul-1945 77 y.o. 04/22/2022   Principle Diagnosis:  History of stage I lobular carcinoma of the left breast - lumpectomy in 2004 Infiltrating ductal carcinoma of the right breast, ER/PR +/HER2 -   Interim History:  Ms. Lauren [name] is here today for follow-up. She is doing quite well. Her right breast lumpectomy site has healed nicely. She is using a cream daily.  She met with Dr. Basilio Cairo yesterday and will start radiation on Monday 5/15 for a total of 15 fractions. She will then start Femara 3 weeks after her last radiation treatment.  She is still on DVT prophylaxis with Eliquis and will finish completely in a few days.  She denies any issues with bleeding. No abnormal bruising, no petechiae.  She has hot flashes and night sweats unchanged from baseline.   No fever, chills, n/v, cough, rash, dizziness, SOB, chest pain, palpitations, abdominal pain or changes in bowel or bladder habits.  No swelling, tenderness, numbness or tingling in her extremities at this time.  No falls or syncope reported.  Appetite and hydration are good. Weight is stable at 166 lbs.   ECOG Performance Status: 0 - Asymptomatic  Medications:  Allergies as of 04/22/2022   No Known Allergies      Medication List        Accurate as of April 22, 2022  2:26 PM. If you have any questions, ask your nurse or doctor.          acetaminophen 500 MG tablet Commonly known as: TYLENOL Take 1,000 mg by mouth every 6 (six) hours as needed for moderate pain.   apixaban 2.5 MG Tabs tablet Commonly known as: ELIQUIS Take 1 tablet (2.5 mg total) by mouth 2 (two) times daily.   Calcium 600+D 600-20 MG-MCG Tabs Generic drug: Calcium Carb-Cholecalciferol Take 1 tablet by mouth every evening.   COSAMIN DS PO Take 1 tablet by mouth in the morning and at bedtime. 1500-1200   diclofenac Sodium 1 % Gel Commonly known as: VOLTAREN Apply 1 Application  topically 2 (two) times daily as needed (joint pain).   docusate sodium 100 MG capsule Commonly known as: Colace Take 1 capsule (100 mg total) by mouth 2 (two) times daily.   ezetimibe 10 MG tablet Commonly known as: ZETIA Take 10 mg by mouth daily. Patient trying every other day due to leg cramping   HYDROcodone-acetaminophen 5-325 MG tablet Commonly known as: Norco Take 1 tablet by mouth every 4 (four) hours as needed for moderate pain.   latanoprost 0.005 % ophthalmic solution Commonly known as: XALATAN Place 1 drop into both eyes at bedtime.   Magnesium 500 MG Tabs Take 500 mg by mouth daily in the afternoon.   ondansetron 4 MG tablet Commonly known as: Zofran Take 1 tablet (4 mg total) by mouth every 8 (eight) hours as needed for nausea or vomiting.   polyethylene glycol 17 g packet Commonly known as: MiraLax Take 17 g by mouth daily as needed for moderate constipation or severe constipation.   senna 8.6 MG Tabs tablet Commonly known as: SENOKOT Take 2 tablets (17.2 mg total) by mouth at bedtime.   Synthroid 112 MCG tablet Generic drug: levothyroxine Take 112 mcg by mouth daily before breakfast.   timolol 0.5 % ophthalmic solution Commonly known as: TIMOPTIC Place 1 drop into both eyes daily.   Vitamin B-12 2500 MCG Subl Take 2,500 mcg by mouth every evening.  Allergies: No Known Allergies  Past Medical History, Surgical history, Social history, and Family History were reviewed and updated.  Review of Systems: All other 10 point review of systems is negative.   Physical Exam:  weight is 166 lb 1.9 oz (75.4 kg). Her oral temperature is 98.7 F (37.1 C). Her blood pressure is 128/77 and her pulse is 79. Her respiration is 17 and oxygen saturation is 98%.   Wt Readings from Last 3 Encounters:  04/22/22 166 lb 1.9 oz (75.4 kg)  04/21/22 165 lb 6.4 oz (75 kg)  03/25/22 160 lb (72.6 kg)    Ocular: Sclerae unicteric, pupils equal, round and  reactive to light Ear-nose-throat: Oropharynx clear, dentition fair Lymphatic: No cervical or supraclavicular adenopathy Lungs no rales or rhonchi, good excursion bilaterally Heart regular rate and rhythm, no murmur appreciated Abd soft, nontender, positive bowel sounds MSK no focal spinal tenderness, no joint edema Neuro: non-focal, well-oriented, appropriate affect Breasts: Same as above  Lab Results  Component Value Date   WBC 9.1 04/22/2022   HGB 13.3 04/22/2022   HCT 41.4 04/22/2022   MCV 92.8 04/22/2022   PLT 315 04/22/2022   No results found for: "FERRITIN", "IRON", "TIBC", "UIBC", "IRONPCTSAT" Lab Results  Component Value Date   RBC 4.46 04/22/2022   No results found for: "KPAFRELGTCHN", "LAMBDASER", "KAPLAMBRATIO" No results found for: "IGGSERUM", "IGA", "IGMSERUM" No results found for: "TOTALPROTELP", "ALBUMINELP", "A1GS", "A2GS", "BETS", "BETA2SER", "GAMS", "MSPIKE", "SPEI"   Chemistry      Component Value Date/Time   NA 135 03/16/2022 1030   NA 143 11/02/2016 1335   NA 141 08/30/2015 1149   K 4.5 03/16/2022 1030   K 4.4 11/02/2016 1335   K 4.2 08/30/2015 1149   CL 101 03/16/2022 1030   CL 104 11/02/2016 1335   CO2 24 03/16/2022 1030   CO2 27 11/02/2016 1335   CO2 27 08/30/2015 1149   BUN 17 03/16/2022 1030   BUN 11 11/02/2016 1335   BUN 16.1 08/30/2015 1149   CREATININE 0.55 03/16/2022 1030   CREATININE 0.79 12/25/2021 1421   CREATININE 0.9 11/02/2016 1335   CREATININE 0.6 08/30/2015 1149      Component Value Date/Time   CALCIUM 8.8 (L) 03/16/2022 1030   CALCIUM 9.2 11/02/2016 1335   CALCIUM 9.3 08/30/2015 1149   ALKPHOS 67 12/25/2021 1421   ALKPHOS 94 (H) 11/02/2016 1335   ALKPHOS 110 08/30/2015 1149   AST 17 12/25/2021 1421   AST 26 08/30/2015 1149   ALT 17 12/25/2021 1421   ALT 25 11/02/2016 1335   ALT 33 08/30/2015 1149   BILITOT 0.4 12/25/2021 1421   BILITOT 0.55 08/30/2015 1149       Impression and Plan: Ms. Ord is a pleasant  postmenopausal 77 yo caucasian female with history of stage I lobular carcinoma of the left breast treated with lumpectomy in 2004 and most recently infiltrating ductal carcinoma of the right breast, ER/PR +/HER2 - .  She had her lumpectomy on 03/02/2022.  She will start radiation therapy on 04/27/2022 for a total of 15 fractions.  She has a follow-up with [place] on 05/20/2022.  Dr. Myna Hidalgo will go ahead and order her Femara 2.5 mg PO daily to start on 06/03/2022.  She will be living in the [place] for 5 months.   Eileen Stanford, NP 4/10/20242:26 PM

## 2022-04-24 DIAGNOSIS — Z170 Estrogen receptor positive status [ER+]: Secondary | ICD-10-CM | POA: Diagnosis not present

## 2022-04-24 DIAGNOSIS — C50411 Malignant neoplasm of upper-outer quadrant of right female breast: Secondary | ICD-10-CM | POA: Diagnosis not present

## 2022-04-24 DIAGNOSIS — Z510 Encounter for antineoplastic radiation therapy: Secondary | ICD-10-CM | POA: Diagnosis not present

## 2022-04-27 ENCOUNTER — Other Ambulatory Visit: Payer: Self-pay

## 2022-04-27 ENCOUNTER — Ambulatory Visit
Admission: RE | Admit: 2022-04-27 | Discharge: 2022-04-27 | Disposition: A | Discharge status: Home / Self Care | Payer: Medicare Other | Source: Ambulatory Visit | Attending: Radiation Oncology | Admitting: Radiation Oncology

## 2022-04-27 DIAGNOSIS — C50411 Malignant neoplasm of upper-outer quadrant of right female breast: Secondary | ICD-10-CM

## 2022-04-27 DIAGNOSIS — Z170 Estrogen receptor positive status [ER+]: Secondary | ICD-10-CM | POA: Diagnosis not present

## 2022-04-27 DIAGNOSIS — Z510 Encounter for antineoplastic radiation therapy: Secondary | ICD-10-CM | POA: Diagnosis not present

## 2022-04-27 LAB — RAD ONC ARIA SESSION SUMMARY
Course Elapsed Days: 0
Plan Fractions Treated to Date: 1
Plan Prescribed Dose Per Fraction: 2.67 Gy
Plan Total Fractions Prescribed: 15
Plan Total Prescribed Dose: 40.05 Gy
Reference Point Dosage Given to Date: 2.67 Gy
Reference Point Session Dosage Given: 2.67 Gy
Session Number: 1

## 2022-04-27 MED ORDER — ALRA NON-METALLIC DEODORANT (RAD-ONC)
1.0000 | Freq: Once | TOPICAL | Status: AC
  Administered 2022-04-27: 1 via TOPICAL

## 2022-04-27 MED ORDER — RADIAPLEXRX EX GEL: Freq: Once | CUTANEOUS | Status: AC

## 2022-04-27 NOTE — Progress Notes (Signed)
Pt here for patient teaching. Pt given Radiation and You booklet, skin care instructions, Alra deodorant, and Radiaplex gel. Reviewed areas of pertinence such as fatigue, hair loss, skin changes, breast tenderness, and breast swelling . Pt able to give teach back of to pat skin, use unscented/gentle soap, have Imodium on hand, and drink plenty of water,apply Radiaplex bid, avoid applying anything to skin within 4 hours of treatment, avoid wearing an under wire bra, and to use an electric razor if they must shave. Pt verbalizes understanding of information given and will contact nursing with any questions or concerns.          

## 2022-04-28 ENCOUNTER — Other Ambulatory Visit: Payer: Self-pay

## 2022-04-28 ENCOUNTER — Ambulatory Visit
Admission: RE | Admit: 2022-04-28 | Discharge: 2022-04-28 | Disposition: A | Discharge status: Home / Self Care | Payer: Medicare Other | Source: Ambulatory Visit | Attending: Radiation Oncology | Admitting: Radiation Oncology

## 2022-04-28 DIAGNOSIS — Z510 Encounter for antineoplastic radiation therapy: Secondary | ICD-10-CM | POA: Diagnosis not present

## 2022-04-28 DIAGNOSIS — Z170 Estrogen receptor positive status [ER+]: Secondary | ICD-10-CM | POA: Diagnosis not present

## 2022-04-28 DIAGNOSIS — C50411 Malignant neoplasm of upper-outer quadrant of right female breast: Secondary | ICD-10-CM | POA: Diagnosis not present

## 2022-04-28 LAB — RAD ONC ARIA SESSION SUMMARY
Course Elapsed Days: 1
Plan Fractions Treated to Date: 2
Plan Prescribed Dose Per Fraction: 2.67 Gy
Plan Total Fractions Prescribed: 15
Plan Total Prescribed Dose: 40.05 Gy
Reference Point Dosage Given to Date: 5.34 Gy
Reference Point Session Dosage Given: 2.67 Gy
Session Number: 2

## 2022-04-29 ENCOUNTER — Ambulatory Visit: Payer: Medicare Other | Admitting: Family

## 2022-04-29 ENCOUNTER — Other Ambulatory Visit: Payer: Medicare Other

## 2022-04-29 ENCOUNTER — Ambulatory Visit
Admission: RE | Admit: 2022-04-29 | Discharge: 2022-04-29 | Disposition: A | Discharge status: Home / Self Care | Payer: Medicare Other | Source: Ambulatory Visit | Attending: Radiation Oncology | Admitting: Radiation Oncology

## 2022-04-29 ENCOUNTER — Other Ambulatory Visit: Payer: Self-pay

## 2022-04-29 DIAGNOSIS — Z170 Estrogen receptor positive status [ER+]: Secondary | ICD-10-CM | POA: Diagnosis not present

## 2022-04-29 DIAGNOSIS — Z510 Encounter for antineoplastic radiation therapy: Secondary | ICD-10-CM | POA: Diagnosis not present

## 2022-04-29 DIAGNOSIS — C50411 Malignant neoplasm of upper-outer quadrant of right female breast: Secondary | ICD-10-CM | POA: Diagnosis not present

## 2022-04-29 LAB — RAD ONC ARIA SESSION SUMMARY
Course Elapsed Days: 2
Plan Fractions Treated to Date: 3
Plan Prescribed Dose Per Fraction: 2.67 Gy
Plan Total Fractions Prescribed: 15
Plan Total Prescribed Dose: 40.05 Gy
Reference Point Dosage Given to Date: 8.01 Gy
Reference Point Session Dosage Given: 2.67 Gy
Session Number: 3

## 2022-04-30 ENCOUNTER — Other Ambulatory Visit: Payer: Self-pay

## 2022-04-30 ENCOUNTER — Ambulatory Visit
Admission: RE | Admit: 2022-04-30 | Discharge: 2022-04-30 | Disposition: A | Discharge status: Home / Self Care | Payer: Medicare Other | Source: Ambulatory Visit | Attending: Radiation Oncology | Admitting: Radiation Oncology

## 2022-04-30 DIAGNOSIS — Z510 Encounter for antineoplastic radiation therapy: Secondary | ICD-10-CM | POA: Diagnosis not present

## 2022-04-30 DIAGNOSIS — C50411 Malignant neoplasm of upper-outer quadrant of right female breast: Secondary | ICD-10-CM | POA: Diagnosis not present

## 2022-04-30 DIAGNOSIS — Z170 Estrogen receptor positive status [ER+]: Secondary | ICD-10-CM | POA: Diagnosis not present

## 2022-04-30 LAB — RAD ONC ARIA SESSION SUMMARY
Course Elapsed Days: 3
Plan Fractions Treated to Date: 4
Plan Prescribed Dose Per Fraction: 2.67 Gy
Plan Total Fractions Prescribed: 15
Plan Total Prescribed Dose: 40.05 Gy
Reference Point Dosage Given to Date: 10.68 Gy
Reference Point Session Dosage Given: 2.67 Gy
Session Number: 4

## 2022-05-01 ENCOUNTER — Other Ambulatory Visit: Payer: Self-pay

## 2022-05-01 ENCOUNTER — Ambulatory Visit
Admission: RE | Admit: 2022-05-01 | Discharge: 2022-05-01 | Disposition: A | Discharge status: Home / Self Care | Payer: Medicare Other | Source: Ambulatory Visit | Attending: Radiation Oncology | Admitting: Radiation Oncology

## 2022-05-01 DIAGNOSIS — C50411 Malignant neoplasm of upper-outer quadrant of right female breast: Secondary | ICD-10-CM | POA: Diagnosis not present

## 2022-05-01 DIAGNOSIS — Z170 Estrogen receptor positive status [ER+]: Secondary | ICD-10-CM | POA: Diagnosis not present

## 2022-05-01 DIAGNOSIS — Z510 Encounter for antineoplastic radiation therapy: Secondary | ICD-10-CM | POA: Diagnosis not present

## 2022-05-01 LAB — RAD ONC ARIA SESSION SUMMARY
Course Elapsed Days: 4
Plan Fractions Treated to Date: 5
Plan Prescribed Dose Per Fraction: 2.67 Gy
Plan Total Fractions Prescribed: 15
Plan Total Prescribed Dose: 40.05 Gy
Reference Point Dosage Given to Date: 13.35 Gy
Reference Point Session Dosage Given: 2.67 Gy
Session Number: 5

## 2022-05-04 ENCOUNTER — Ambulatory Visit: Payer: Medicare Other

## 2022-05-04 ENCOUNTER — Ambulatory Visit
Admission: RE | Admit: 2022-05-04 | Discharge: 2022-05-04 | Disposition: A | Discharge status: Home / Self Care | Payer: Medicare Other | Source: Ambulatory Visit | Attending: Radiation Oncology | Admitting: Radiation Oncology

## 2022-05-04 ENCOUNTER — Other Ambulatory Visit: Payer: Self-pay

## 2022-05-04 DIAGNOSIS — C50411 Malignant neoplasm of upper-outer quadrant of right female breast: Secondary | ICD-10-CM | POA: Diagnosis not present

## 2022-05-04 DIAGNOSIS — Z510 Encounter for antineoplastic radiation therapy: Secondary | ICD-10-CM | POA: Diagnosis not present

## 2022-05-04 DIAGNOSIS — Z170 Estrogen receptor positive status [ER+]: Secondary | ICD-10-CM | POA: Diagnosis not present

## 2022-05-04 LAB — RAD ONC ARIA SESSION SUMMARY
Course Elapsed Days: 7
Plan Fractions Treated to Date: 6
Plan Prescribed Dose Per Fraction: 2.67 Gy
Plan Total Fractions Prescribed: 15
Plan Total Prescribed Dose: 40.05 Gy
Reference Point Dosage Given to Date: 16.02 Gy
Reference Point Session Dosage Given: 2.67 Gy
Session Number: 6

## 2022-05-05 ENCOUNTER — Other Ambulatory Visit: Payer: Self-pay

## 2022-05-05 ENCOUNTER — Ambulatory Visit: Payer: Medicare Other

## 2022-05-05 ENCOUNTER — Ambulatory Visit
Admission: RE | Admit: 2022-05-05 | Discharge: 2022-05-05 | Disposition: A | Discharge status: Home / Self Care | Payer: Medicare Other | Source: Ambulatory Visit | Attending: Radiation Oncology | Admitting: Radiation Oncology

## 2022-05-05 DIAGNOSIS — Z510 Encounter for antineoplastic radiation therapy: Secondary | ICD-10-CM | POA: Diagnosis not present

## 2022-05-05 DIAGNOSIS — Z170 Estrogen receptor positive status [ER+]: Secondary | ICD-10-CM | POA: Diagnosis not present

## 2022-05-05 DIAGNOSIS — C50411 Malignant neoplasm of upper-outer quadrant of right female breast: Secondary | ICD-10-CM | POA: Diagnosis not present

## 2022-05-05 LAB — RAD ONC ARIA SESSION SUMMARY
Course Elapsed Days: 8
Plan Fractions Treated to Date: 7
Plan Prescribed Dose Per Fraction: 2.67 Gy
Plan Total Fractions Prescribed: 15
Plan Total Prescribed Dose: 40.05 Gy
Reference Point Dosage Given to Date: 18.69 Gy
Reference Point Session Dosage Given: 2.67 Gy
Session Number: 7

## 2022-05-06 ENCOUNTER — Ambulatory Visit
Admission: RE | Admit: 2022-05-06 | Discharge: 2022-05-06 | Disposition: A | Discharge status: Home / Self Care | Payer: Medicare Other | Source: Ambulatory Visit | Attending: Radiation Oncology | Admitting: Radiation Oncology

## 2022-05-06 ENCOUNTER — Other Ambulatory Visit: Payer: Self-pay

## 2022-05-06 ENCOUNTER — Ambulatory Visit: Payer: Medicare Other

## 2022-05-06 DIAGNOSIS — Z510 Encounter for antineoplastic radiation therapy: Secondary | ICD-10-CM | POA: Diagnosis not present

## 2022-05-06 DIAGNOSIS — Z170 Estrogen receptor positive status [ER+]: Secondary | ICD-10-CM | POA: Diagnosis not present

## 2022-05-06 DIAGNOSIS — C50411 Malignant neoplasm of upper-outer quadrant of right female breast: Secondary | ICD-10-CM | POA: Diagnosis not present

## 2022-05-06 LAB — RAD ONC ARIA SESSION SUMMARY
Course Elapsed Days: 9
Plan Fractions Treated to Date: 8
Plan Prescribed Dose Per Fraction: 2.67 Gy
Plan Total Fractions Prescribed: 15
Plan Total Prescribed Dose: 40.05 Gy
Reference Point Dosage Given to Date: 21.36 Gy
Reference Point Session Dosage Given: 2.67 Gy
Session Number: 8

## 2022-05-07 ENCOUNTER — Ambulatory Visit
Admission: RE | Admit: 2022-05-07 | Discharge: 2022-05-07 | Disposition: A | Discharge status: Home / Self Care | Payer: Medicare Other | Source: Ambulatory Visit | Attending: Radiation Oncology | Admitting: Radiation Oncology

## 2022-05-07 ENCOUNTER — Other Ambulatory Visit: Payer: Self-pay

## 2022-05-07 ENCOUNTER — Ambulatory Visit: Payer: Medicare Other

## 2022-05-07 DIAGNOSIS — Z170 Estrogen receptor positive status [ER+]: Secondary | ICD-10-CM | POA: Diagnosis not present

## 2022-05-07 DIAGNOSIS — Z510 Encounter for antineoplastic radiation therapy: Secondary | ICD-10-CM | POA: Diagnosis not present

## 2022-05-07 DIAGNOSIS — C50411 Malignant neoplasm of upper-outer quadrant of right female breast: Secondary | ICD-10-CM | POA: Diagnosis not present

## 2022-05-07 LAB — RAD ONC ARIA SESSION SUMMARY
Course Elapsed Days: 10
Plan Fractions Treated to Date: 9
Plan Prescribed Dose Per Fraction: 2.67 Gy
Plan Total Fractions Prescribed: 15
Plan Total Prescribed Dose: 40.05 Gy
Reference Point Dosage Given to Date: 24.03 Gy
Reference Point Session Dosage Given: 2.67 Gy
Session Number: 9

## 2022-05-08 ENCOUNTER — Other Ambulatory Visit: Payer: Self-pay

## 2022-05-08 ENCOUNTER — Ambulatory Visit: Payer: Medicare Other

## 2022-05-08 ENCOUNTER — Ambulatory Visit
Admission: RE | Admit: 2022-05-08 | Discharge: 2022-05-08 | Disposition: A | Discharge status: Home / Self Care | Payer: Medicare Other | Source: Ambulatory Visit | Attending: Radiation Oncology | Admitting: Radiation Oncology

## 2022-05-08 DIAGNOSIS — Z510 Encounter for antineoplastic radiation therapy: Secondary | ICD-10-CM | POA: Diagnosis not present

## 2022-05-08 DIAGNOSIS — C50411 Malignant neoplasm of upper-outer quadrant of right female breast: Secondary | ICD-10-CM | POA: Diagnosis not present

## 2022-05-08 DIAGNOSIS — Z170 Estrogen receptor positive status [ER+]: Secondary | ICD-10-CM | POA: Diagnosis not present

## 2022-05-08 LAB — RAD ONC ARIA SESSION SUMMARY
Course Elapsed Days: 11
Plan Fractions Treated to Date: 10
Plan Prescribed Dose Per Fraction: 2.67 Gy
Plan Total Fractions Prescribed: 15
Plan Total Prescribed Dose: 40.05 Gy
Reference Point Dosage Given to Date: 26.7 Gy
Reference Point Session Dosage Given: 2.67 Gy
Session Number: 10

## 2022-05-11 ENCOUNTER — Ambulatory Visit: Payer: Medicare Other

## 2022-05-11 ENCOUNTER — Ambulatory Visit
Admission: RE | Admit: 2022-05-11 | Discharge: 2022-05-11 | Disposition: A | Discharge status: Home / Self Care | Payer: Medicare Other | Source: Ambulatory Visit | Attending: Radiation Oncology | Admitting: Radiation Oncology

## 2022-05-11 ENCOUNTER — Other Ambulatory Visit: Payer: Self-pay

## 2022-05-11 DIAGNOSIS — Z471 Aftercare following joint replacement surgery: Secondary | ICD-10-CM | POA: Diagnosis not present

## 2022-05-11 DIAGNOSIS — Z96641 Presence of right artificial hip joint: Secondary | ICD-10-CM | POA: Diagnosis not present

## 2022-05-11 DIAGNOSIS — Z510 Encounter for antineoplastic radiation therapy: Secondary | ICD-10-CM | POA: Diagnosis not present

## 2022-05-11 DIAGNOSIS — C50411 Malignant neoplasm of upper-outer quadrant of right female breast: Secondary | ICD-10-CM | POA: Diagnosis not present

## 2022-05-11 DIAGNOSIS — Z170 Estrogen receptor positive status [ER+]: Secondary | ICD-10-CM | POA: Diagnosis not present

## 2022-05-11 LAB — RAD ONC ARIA SESSION SUMMARY
Course Elapsed Days: 14
Plan Fractions Treated to Date: 11
Plan Prescribed Dose Per Fraction: 2.67 Gy
Plan Total Fractions Prescribed: 15
Plan Total Prescribed Dose: 40.05 Gy
Reference Point Dosage Given to Date: 29.37 Gy
Reference Point Session Dosage Given: 2.67 Gy
Session Number: 11

## 2022-05-12 ENCOUNTER — Other Ambulatory Visit: Payer: Self-pay

## 2022-05-12 ENCOUNTER — Ambulatory Visit: Payer: Medicare Other

## 2022-05-12 ENCOUNTER — Ambulatory Visit
Admission: RE | Admit: 2022-05-12 | Discharge: 2022-05-12 | Disposition: A | Discharge status: Home / Self Care | Payer: Medicare Other | Source: Ambulatory Visit | Attending: Radiation Oncology | Admitting: Radiation Oncology

## 2022-05-12 DIAGNOSIS — Z170 Estrogen receptor positive status [ER+]: Secondary | ICD-10-CM | POA: Diagnosis not present

## 2022-05-12 DIAGNOSIS — Z510 Encounter for antineoplastic radiation therapy: Secondary | ICD-10-CM | POA: Diagnosis not present

## 2022-05-12 DIAGNOSIS — C50411 Malignant neoplasm of upper-outer quadrant of right female breast: Secondary | ICD-10-CM | POA: Diagnosis not present

## 2022-05-12 LAB — RAD ONC ARIA SESSION SUMMARY
Course Elapsed Days: 15
Plan Fractions Treated to Date: 12
Plan Prescribed Dose Per Fraction: 2.67 Gy
Plan Total Fractions Prescribed: 15
Plan Total Prescribed Dose: 40.05 Gy
Reference Point Dosage Given to Date: 32.04 Gy
Reference Point Session Dosage Given: 2.67 Gy
Session Number: 12

## 2022-05-13 ENCOUNTER — Other Ambulatory Visit: Payer: Self-pay

## 2022-05-13 ENCOUNTER — Ambulatory Visit
Admission: RE | Admit: 2022-05-13 | Discharge: 2022-05-13 | Disposition: A | Discharge status: Home / Self Care | Payer: Medicare Other | Source: Ambulatory Visit | Attending: Radiation Oncology | Admitting: Radiation Oncology

## 2022-05-13 ENCOUNTER — Ambulatory Visit: Payer: Medicare Other

## 2022-05-13 DIAGNOSIS — Z170 Estrogen receptor positive status [ER+]: Secondary | ICD-10-CM | POA: Insufficient documentation

## 2022-05-13 DIAGNOSIS — Z510 Encounter for antineoplastic radiation therapy: Secondary | ICD-10-CM | POA: Diagnosis not present

## 2022-05-13 DIAGNOSIS — C50411 Malignant neoplasm of upper-outer quadrant of right female breast: Secondary | ICD-10-CM | POA: Insufficient documentation

## 2022-05-13 LAB — RAD ONC ARIA SESSION SUMMARY
Course Elapsed Days: 16
Plan Fractions Treated to Date: 13
Plan Prescribed Dose Per Fraction: 2.67 Gy
Plan Total Fractions Prescribed: 15
Plan Total Prescribed Dose: 40.05 Gy
Reference Point Dosage Given to Date: 34.71 Gy
Reference Point Session Dosage Given: 2.67 Gy
Session Number: 13

## 2022-05-14 ENCOUNTER — Ambulatory Visit
Admission: RE | Admit: 2022-05-14 | Discharge: 2022-05-14 | Disposition: A | Discharge status: Home / Self Care | Payer: Medicare Other | Source: Ambulatory Visit | Attending: Radiation Oncology | Admitting: Radiation Oncology

## 2022-05-14 ENCOUNTER — Other Ambulatory Visit: Payer: Self-pay

## 2022-05-14 ENCOUNTER — Ambulatory Visit: Payer: Medicare Other

## 2022-05-14 DIAGNOSIS — C50411 Malignant neoplasm of upper-outer quadrant of right female breast: Secondary | ICD-10-CM | POA: Diagnosis not present

## 2022-05-14 DIAGNOSIS — Z170 Estrogen receptor positive status [ER+]: Secondary | ICD-10-CM | POA: Diagnosis not present

## 2022-05-14 DIAGNOSIS — Z510 Encounter for antineoplastic radiation therapy: Secondary | ICD-10-CM | POA: Diagnosis not present

## 2022-05-14 LAB — RAD ONC ARIA SESSION SUMMARY
Course Elapsed Days: 17
Plan Fractions Treated to Date: 14
Plan Prescribed Dose Per Fraction: 2.67 Gy
Plan Total Fractions Prescribed: 15
Plan Total Prescribed Dose: 40.05 Gy
Reference Point Dosage Given to Date: 37.38 Gy
Reference Point Session Dosage Given: 2.67 Gy
Session Number: 14

## 2022-05-15 ENCOUNTER — Ambulatory Visit: Payer: Medicare Other

## 2022-05-18 ENCOUNTER — Ambulatory Visit: Payer: Medicare Other

## 2022-05-19 ENCOUNTER — Ambulatory Visit: Payer: Medicare Other | Admitting: Hematology & Oncology

## 2022-05-19 ENCOUNTER — Ambulatory Visit: Payer: Medicare Other

## 2022-05-19 ENCOUNTER — Other Ambulatory Visit: Payer: Medicare Other

## 2022-05-20 ENCOUNTER — Ambulatory Visit: Payer: Medicare Other

## 2022-05-20 ENCOUNTER — Ambulatory Visit
Admission: RE | Admit: 2022-05-20 | Discharge: 2022-05-20 | Disposition: A | Discharge status: Home / Self Care | Payer: Medicare Other | Source: Ambulatory Visit | Attending: Radiation Oncology | Admitting: Radiation Oncology

## 2022-05-20 ENCOUNTER — Telehealth: Payer: Self-pay | Admitting: *Deleted

## 2022-05-20 ENCOUNTER — Other Ambulatory Visit: Payer: Self-pay

## 2022-05-20 ENCOUNTER — Telehealth: Payer: Self-pay | Admitting: Radiation Oncology

## 2022-05-20 DIAGNOSIS — Z170 Estrogen receptor positive status [ER+]: Secondary | ICD-10-CM | POA: Diagnosis not present

## 2022-05-20 DIAGNOSIS — Z510 Encounter for antineoplastic radiation therapy: Secondary | ICD-10-CM | POA: Diagnosis not present

## 2022-05-20 DIAGNOSIS — C50411 Malignant neoplasm of upper-outer quadrant of right female breast: Secondary | ICD-10-CM | POA: Diagnosis not present

## 2022-05-20 LAB — RAD ONC ARIA SESSION SUMMARY
Course Elapsed Days: 23
Plan Fractions Treated to Date: 15
Plan Prescribed Dose Per Fraction: 2.67 Gy
Plan Total Fractions Prescribed: 15
Plan Total Prescribed Dose: 40.05 Gy
Reference Point Dosage Given to Date: 40.05 Gy
Reference Point Session Dosage Given: 2.67 Gy
Session Number: 15

## 2022-05-20 NOTE — Telephone Encounter (Signed)
RETURNED PATIENT'S PHONE CALL, SPOKE WITH PATIENT. ?

## 2022-05-20 NOTE — Telephone Encounter (Signed)
5/8 @ 1:48 pm Patient called and left voicemail to cancel or reschedule her appt with Dr. Basilio Cairo for 6/20, [place].  Called and left voicemail with Darryl Nestle so they are aware.

## 2022-05-21 ENCOUNTER — Inpatient Hospital Stay (HOSPITAL_BASED_OUTPATIENT_CLINIC_OR_DEPARTMENT_OTHER): Payer: Medicare Other | Admitting: Hematology & Oncology

## 2022-05-21 ENCOUNTER — Encounter: Payer: Self-pay | Admitting: Hematology & Oncology

## 2022-05-21 ENCOUNTER — Inpatient Hospital Stay: Payer: Medicare Other | Attending: Hematology & Oncology

## 2022-05-21 ENCOUNTER — Ambulatory Visit: Payer: Medicare Other

## 2022-05-21 VITALS — BP 146/89 | HR 74 | Temp 98.2°F | Resp 20 | Ht 61.0 in | Wt 168.0 lb

## 2022-05-21 DIAGNOSIS — Z923 Personal history of irradiation: Secondary | ICD-10-CM | POA: Diagnosis not present

## 2022-05-21 DIAGNOSIS — M1611 Unilateral primary osteoarthritis, right hip: Secondary | ICD-10-CM

## 2022-05-21 DIAGNOSIS — Z79811 Long term (current) use of aromatase inhibitors: Secondary | ICD-10-CM | POA: Insufficient documentation

## 2022-05-21 DIAGNOSIS — Z170 Estrogen receptor positive status [ER+]: Secondary | ICD-10-CM | POA: Diagnosis not present

## 2022-05-21 DIAGNOSIS — M85851 Other specified disorders of bone density and structure, right thigh: Secondary | ICD-10-CM | POA: Diagnosis not present

## 2022-05-21 DIAGNOSIS — C50912 Malignant neoplasm of unspecified site of left female breast: Secondary | ICD-10-CM

## 2022-05-21 DIAGNOSIS — Z853 Personal history of malignant neoplasm of breast: Secondary | ICD-10-CM | POA: Diagnosis not present

## 2022-05-21 DIAGNOSIS — C50911 Malignant neoplasm of unspecified site of right female breast: Secondary | ICD-10-CM | POA: Insufficient documentation

## 2022-05-21 LAB — CMP (CANCER CENTER ONLY)
ALT: 10 U/L (ref 0–44)
AST: 15 U/L (ref 15–41)
Albumin: 4.1 g/dL (ref 3.5–5.0)
Alkaline Phosphatase: 78 U/L (ref 38–126)
Anion gap: 6 (ref 5–15)
BUN: 15 mg/dL (ref 8–23)
CO2: 28 mmol/L (ref 22–32)
Calcium: 8.9 mg/dL (ref 8.9–10.3)
Chloride: 102 mmol/L (ref 98–111)
Creatinine: 0.56 mg/dL (ref 0.44–1.00)
GFR, Estimated: >60 mL/min (ref >60)
Glucose, Bld: 71 mg/dL (ref 70–99)
Potassium: 4.5 mmol/L (ref 3.5–5.1)
Sodium: 136 mmol/L (ref 135–145)
Total Bilirubin: 0.5 mg/dL (ref 0.3–1.2)
Total Protein: 6.8 g/dL (ref 6.5–8.1)

## 2022-05-21 LAB — CBC WITH DIFFERENTIAL (CANCER CENTER ONLY)
Abs Immature Granulocytes: 0.04 10*3/uL (ref 0.00–0.07)
Basophils Absolute: 0.1 10*3/uL (ref 0.0–0.1)
Basophils Relative: 1 %
Eosinophils Absolute: 0.2 10*3/uL (ref 0.0–0.5)
Eosinophils Relative: 3 %
HCT: 39.7 % (ref 36.0–46.0)
Hemoglobin: 13 g/dL (ref 12.0–15.0)
Immature Granulocytes: 1 %
Lymphocytes Relative: 18 %
Lymphs Abs: 1.1 10*3/uL (ref 0.7–4.0)
MCH: 30.4 pg (ref 26.0–34.0)
MCHC: 32.7 g/dL (ref 30.0–36.0)
MCV: 92.8 fL (ref 80.0–100.0)
Monocytes Absolute: 0.9 10*3/uL (ref 0.1–1.0)
Monocytes Relative: 14 %
Neutro Abs: 3.9 10*3/uL (ref 1.7–7.7)
Neutrophils Relative %: 63 %
Platelet Count: 244 10*3/uL (ref 150–400)
RBC: 4.28 MIL/uL (ref 3.87–5.11)
RDW: 13.3 % (ref 11.5–15.5)
WBC Count: 6.1 10*3/uL (ref 4.0–10.5)
nRBC: 0 % (ref 0.0–0.2)

## 2022-05-21 MED ORDER — LETROZOLE 2.5 MG PO TABS: 2.5000 mg | ORAL_TABLET | Freq: Every day | ORAL | 4 refills | Status: DC

## 2022-05-21 NOTE — Progress Notes (Signed)
Hematology and Oncology Follow Up Visit  Lauren [name] [id number] 1945/07/31 77 y.o. 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 daily-*on 05/25/2022   Interim History:  Lauren [name] is here today for follow-up.  She finished her radiation therapy.  She finished this yesterday.  She had a little bit of radiation changes on her skin but no skin breakdown.  We will now start her on Femara.  I do not see a problem with her on Femara.  She is also going to restart her methotrexate.  She does this weekly.  She and her husband will be spending the summer and fall down in the [place].  They have a house down there.  I know that she will have a good time.  I told her to make sure that she wears a lot of sunscreen.  She has had no problems with nausea or vomiting.  She has had no problems with fever.  There is been no bleeding.  She has had no cough or shortness of breath.  Overall, I would say performance status is probably ECOG 1.    Medications:  Allergies as of 05/21/2022   No Known Allergies      Medication List        Accurate as of May 21, 2022 10:19 AM. If you have any questions, ask your nurse or doctor.          STOP taking these medications    apixaban 2.5 MG Tabs tablet Commonly known as: ELIQUIS Stopped by: Josph Macho, MD   docusate sodium 100 MG capsule Commonly known as: Colace Stopped by: Josph Macho, MD   HYDROcodone-acetaminophen 5-325 MG tablet Commonly known as: Norco Stopped by: Josph Macho, MD   ondansetron 4 MG tablet Commonly known as: Zofran Stopped by: Josph Macho, MD   polyethylene glycol 17 g packet Commonly known as: MiraLax Stopped by: Josph Macho, MD   senna 8.6 MG Tabs  tablet Commonly known as: SENOKOT Stopped by: Josph Macho, MD       TAKE these medications    acetaminophen 500 MG tablet Commonly known as: TYLENOL Take 1,000 mg by mouth every 6 (six) hours as needed for moderate pain.   Calcium 600+D 600-20 MG-MCG Tabs Generic drug: Calcium Carb-Cholecalciferol Take 1 tablet by mouth every evening.   COSAMIN DS PO Take 1 tablet by mouth in the morning and at bedtime. 1500-1200   diclofenac Sodium 1 % Gel Commonly known as: VOLTAREN Apply 1 Application topically 2 (two) times daily as needed (joint pain).   ezetimibe 10 MG tablet Commonly known as: ZETIA Take 10 mg by mouth daily. Patient trying every other day due to leg cramping   folic acid 1 MG tablet Commonly known as: FOLVITE Take 1 mg by mouth daily.   latanoprost 0.005 % ophthalmic solution Commonly known as: XALATAN Place 1 drop into both eyes at bedtime.   Magnesium 500 MG Tabs Take 500 mg by mouth daily in the afternoon.   methotrexate 2.5 MG tablet Commonly known as: RHEUMATREX Take 15 mg by mouth once a week.   naproxen 250 MG tablet Commonly known as: NAPROSYN Take 250 mg by mouth daily as needed.   Synthroid 112 MCG tablet Generic  drug: levothyroxine Take 112 mcg by mouth daily before breakfast.   timolol 0.5 % ophthalmic solution Commonly known as: TIMOPTIC Place 1 drop into both eyes daily.   Vitamin B-12 2500 MCG Subl Take 2,500 mcg by mouth every evening.        Allergies: No Known Allergies  Past Medical History, Surgical history, Social history, and Family History were reviewed and updated.  Review of Systems: Review of Systems  Constitutional: Negative.   HENT: Negative.    Eyes: Negative.   Respiratory: Negative.    Cardiovascular: Negative.   Gastrointestinal: Negative.   Genitourinary: Negative.   Musculoskeletal: Negative.   Skin: Negative.   Neurological: Negative.   Endo/Heme/Allergies: Negative.   Psychiatric/Behavioral:  Negative.        Physical Exam:  height is 5\' 1"  (1.549 m) and weight is 168 lb (76.2 kg). Her oral temperature is 98.2 F (36.8 C). Her blood pressure is 146/89 (abnormal) and her pulse is 74. Her respiration is 20 and oxygen saturation is 99%.   Wt Readings from Last 3 Encounters:  05/21/22 168 lb (76.2 kg)  04/22/22 166 lb 1.9 oz (75.4 kg)  04/21/22 165 lb 6.4 oz (75 kg)    Physical Exam Vitals reviewed.  Constitutional:      Comments: Her breast exam shows left breast with a well-healed lumpectomy scar at about the 1 o'clock position.  There is no left axillary adenopathy.  Her right breast shows a well healed lumpectomy scar at about the 10 o'clock position.  There is some slight erythema on the right breast.  There is no right nipple discharge.  There is no right axillary adenopathy.  HENT:     Head: Normocephalic and atraumatic.  Eyes:     Pupils: Pupils are equal, round, and reactive to light.  Cardiovascular:     Rate and Rhythm: Normal rate and regular rhythm.     Heart sounds: Normal heart sounds.  Pulmonary:     Effort: Pulmonary effort is normal.     Breath sounds: Normal breath sounds.  Abdominal:     General: Bowel sounds are normal.     Palpations: Abdomen is soft.  Musculoskeletal:        General: No tenderness or deformity. Normal range of motion.     Cervical back: Normal range of motion.  Lymphadenopathy:     Cervical: No cervical adenopathy.  Skin:    General: Skin is warm and dry.     Findings: No erythema or rash.  Neurological:     Mental Status: She is alert and oriented to person, place, and time.  Psychiatric:        Behavior: Behavior normal.        Thought Content: Thought content normal.        Judgment: Judgment normal.      Lab Results  Component Value Date   WBC 6.1 05/21/2022   HGB 13.0 05/21/2022   HCT 39.7 05/21/2022   MCV 92.8 05/21/2022   PLT 244 05/21/2022   No results found for: "FERRITIN", "IRON", "TIBC", "UIBC",  "IRONPCTSAT" Lab Results  Component Value Date   RBC 4.28 05/21/2022   No results found for: "KPAFRELGTCHN", "LAMBDASER", "KAPLAMBRATIO" No results found for: "IGGSERUM", "IGA", "IGMSERUM" No results found for: "TOTALPROTELP", "ALBUMINELP", "A1GS", "A2GS", "BETS", "BETA2SER", "GAMS", "MSPIKE", "SPEI"   Chemistry      Component Value Date/Time   NA 136 05/21/2022 0856   NA 143 11/02/2016 1335   NA 141 08/30/2015 1149  K 4.5 05/21/2022 0856   K 4.4 11/02/2016 1335   K 4.2 08/30/2015 1149   CL 102 05/21/2022 0856   CL 104 11/02/2016 1335   CO2 28 05/21/2022 0856   CO2 27 11/02/2016 1335   CO2 27 08/30/2015 1149   BUN 15 05/21/2022 0856   BUN 11 11/02/2016 1335   BUN 16.1 08/30/2015 1149   CREATININE 0.56 05/21/2022 0856   CREATININE 0.9 11/02/2016 1335   CREATININE 0.6 08/30/2015 1149      Component Value Date/Time   CALCIUM 8.9 05/21/2022 0856   CALCIUM 9.2 11/02/2016 1335   CALCIUM 9.3 08/30/2015 1149   ALKPHOS 78 05/21/2022 0856   ALKPHOS 94 (H) 11/02/2016 1335   ALKPHOS 110 08/30/2015 1149   AST 15 05/21/2022 0856   AST 26 08/30/2015 1149   ALT 10 05/21/2022 0856   ALT 25 11/02/2016 1335   ALT 33 08/30/2015 1149   BILITOT 0.5 05/21/2022 0856   BILITOT 0.55 08/30/2015 1149       Impression and Plan: Ms. Sobczyk is a pleasant postmenopausal 77 yo caucasian female with history of stage I lobular carcinoma of the left breast treated with lumpectomy in 2004 and most recently infiltrating ductal carcinoma of the right breast, ER/PR +/HER2 - .   She had her lumpectomy on 03/02/2022.   She completed her radiation therapy.  She did well with this.  We will now have her on Femara.  Again she will be gone for 5-6 months.  I will plan to see her back when she returns from the [place].  If she has any problems down the [place], she will let us know.   Josph Macho, MD 5/9/202410:19 AM

## 2022-05-22 ENCOUNTER — Encounter: Payer: Self-pay | Admitting: *Deleted

## 2022-05-22 ENCOUNTER — Ambulatory Visit: Payer: Medicare Other

## 2022-05-22 NOTE — Progress Notes (Signed)
Patient has completed radiation and will now start AI. She will be leaving the country for her scheduled time in the [place]. We will see her later this year in follow up.  Oncology Nurse Navigator Documentation     05/22/2022    8:45 AM  Oncology Nurse Navigator Flowsheets  Phase of Treatment AI  Aromatase Inhibitor Actual Start Date: 05/21/2022  Radiation Actual End Date: 05/20/2022  Navigator Follow Up Date: 12/17/2022  Navigator Follow Up Reason: Follow-up Appointment  Navigator Location [facility]  Navigator Encounter Type Appt/Treatment Plan Review  Patient Visit Type MedOnc  Treatment Phase Active Tx  Barriers/Navigation Needs No Barriers At This Time  Interventions None Required  Acuity Level 1-No Barriers  Support Groups/Services Friends and Family  Time Spent with Patient 15

## 2022-05-25 NOTE — Radiation Completion Notes (Signed)
Patient Name: Lauren [name], Lauren [name] MRN: [id number] Date of Birth: 07-26-1945 Referring Physician: Carylon Perches, M.D. Date of Service: 2022-05-25 Radiation Oncologist: Lonie Peak, M.D. [facility]                             RADIATION ONCOLOGY END OF TREATMENT NOTE     Diagnosis: C50.411 Malignant neoplasm of upper-outer quadrant of right female breast Staging on 2022-03-17: Stage I breast cancer, left (HCC) T=cT1b, N=cN0, M=cM0 Staging on 2022-02-17: Malignant neoplasm of upper-outer quadrant of right breast in female, estrogen receptor positive (HCC) T=cT1b, N=cN0, M=cM0 Intent: Curative     ==========DELIVERED PLANS==========  First Treatment Date: 2022-04-27 - Last Treatment Date: 2022-05-20   Plan Name: Breast_R Site: Breast, Right Technique: 3D Mode: Photon Dose Per Fraction: 2.67 Gy Prescribed Dose (Delivered / Prescribed): 40.05 Gy / 40.05 Gy Prescribed Fxs (Delivered / Prescribed): 15 / 15     ==========ON TREATMENT VISIT DATES========== 2022-04-27, 2022-05-04, 2022-05-11     ==========UPCOMING VISITS==========       ==========APPENDIX - ON TREATMENT VISIT NOTES==========   See weekly On Treatment Notes is Epic for details.

## 2022-07-02 ENCOUNTER — Ambulatory Visit: Payer: Medicare Other | Admitting: Radiation Oncology

## 2022-07-04 IMAGING — MG MM DIGITAL SCREENING BILAT W/ TOMO AND CAD
6 of 10 series · 6 of 30 positions shown · non-contrast
Comparison: Previous exam(s).

CLINICAL DATA: Screening.

EXAM:
DIGITAL SCREENING BILATERAL MAMMOGRAM WITH TOMOSYNTHESIS AND CAD
TECHNIQUE: Bilateral screening digital craniocaudal and mediolateral oblique
mammograms were obtained. Bilateral screening digital breast
tomosynthesis was performed. The images were evaluated with
computer-aided detection.

[R CC synth-2D]
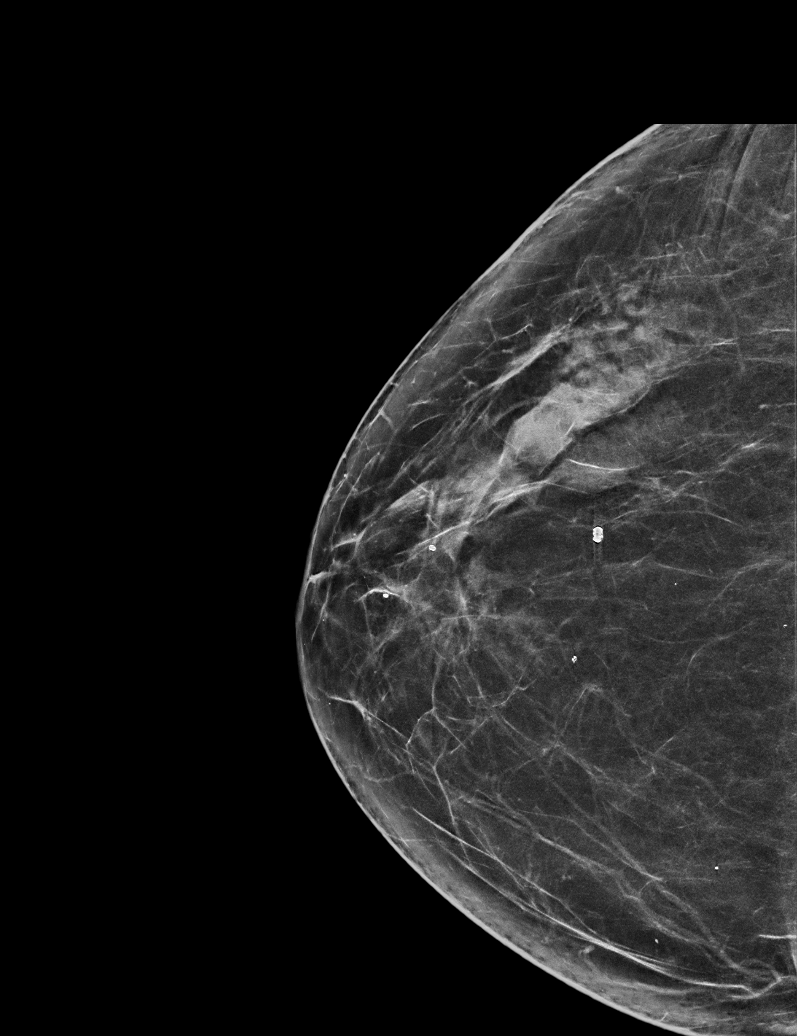

[L CC synth-2D]
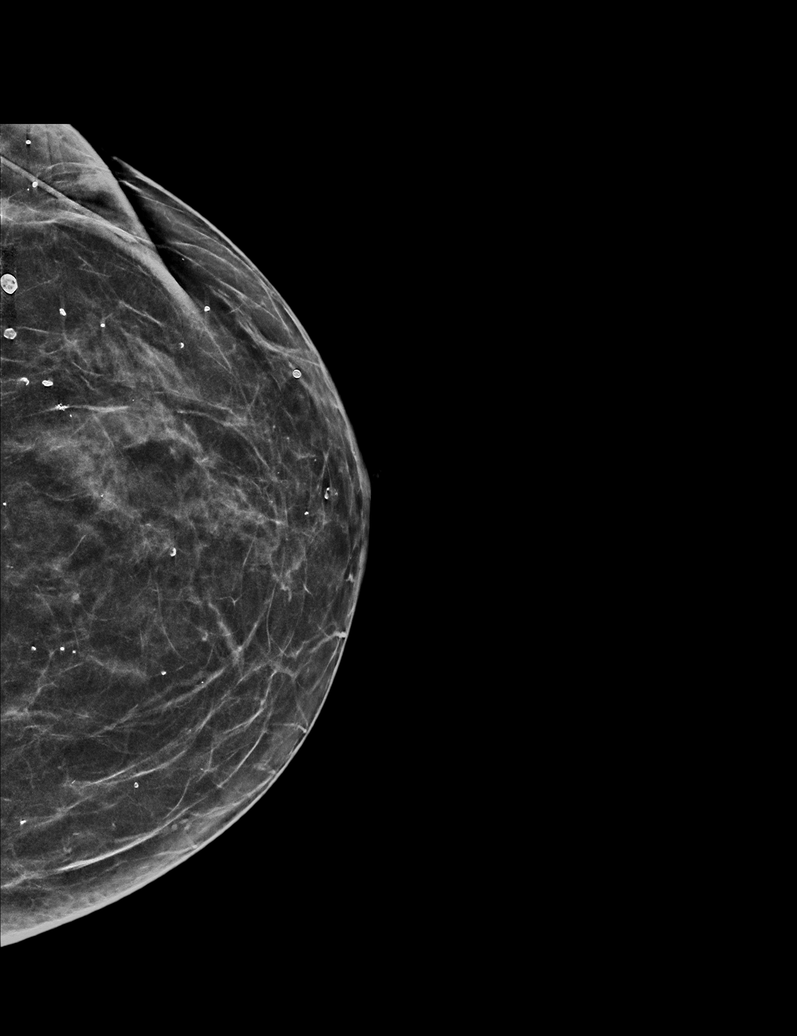

[R CV synth-2D]
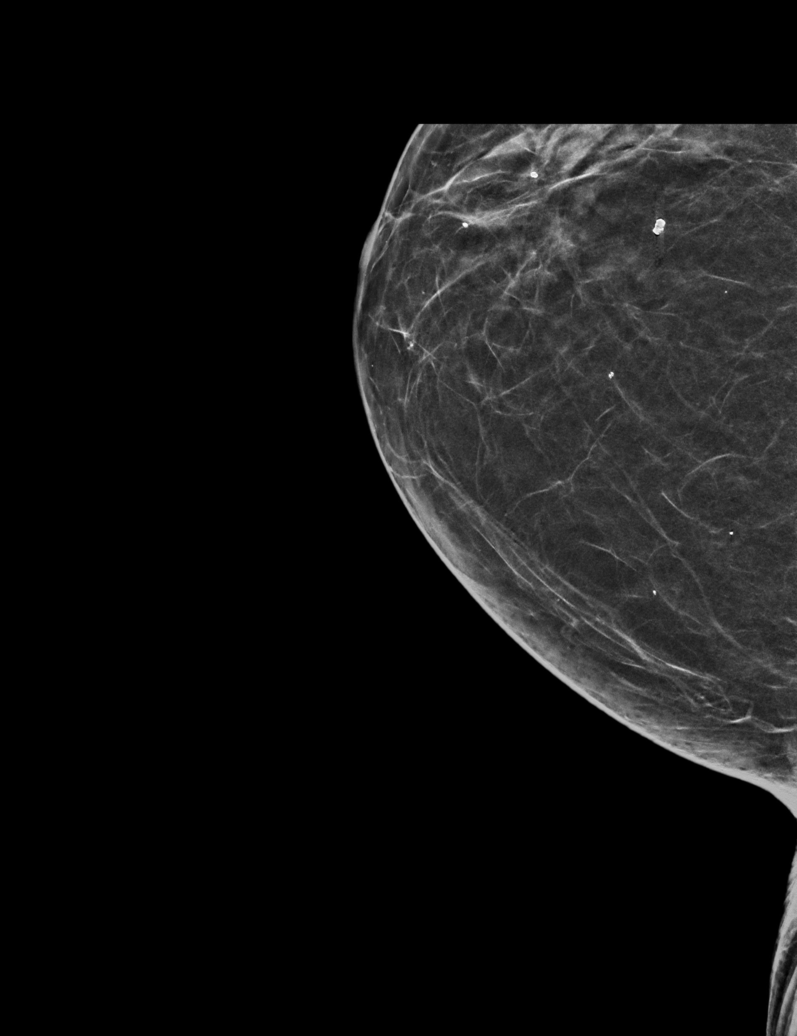

[R MLO synth-2D]
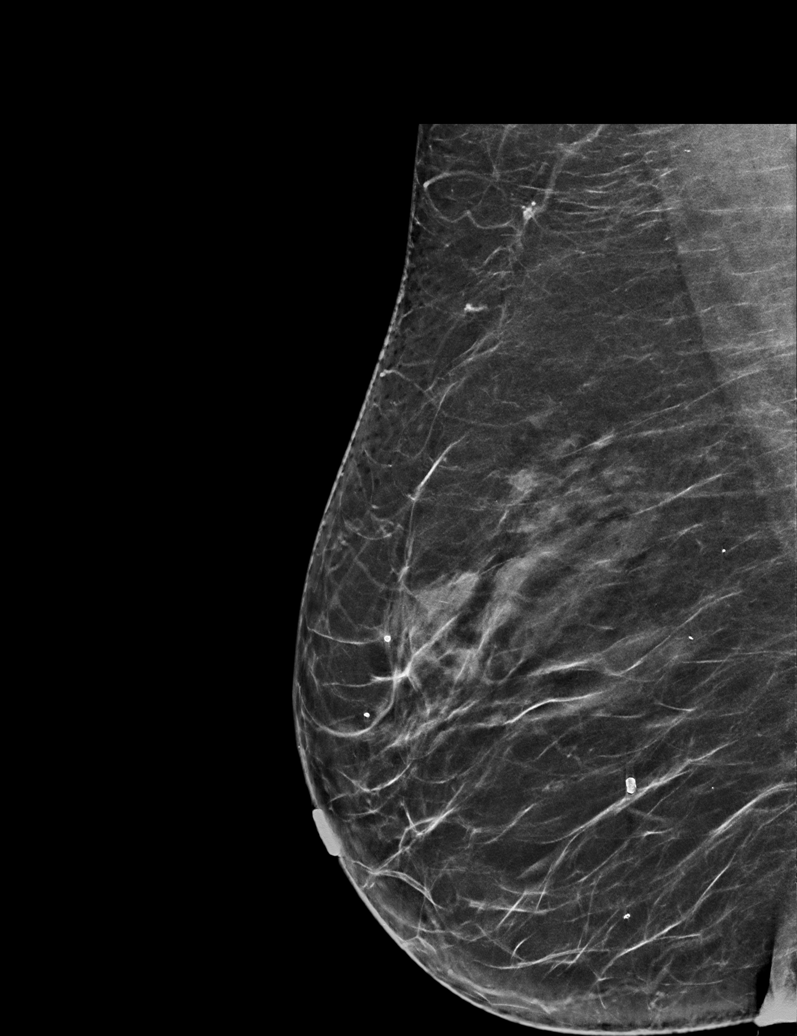

[L MLO synth-2D]
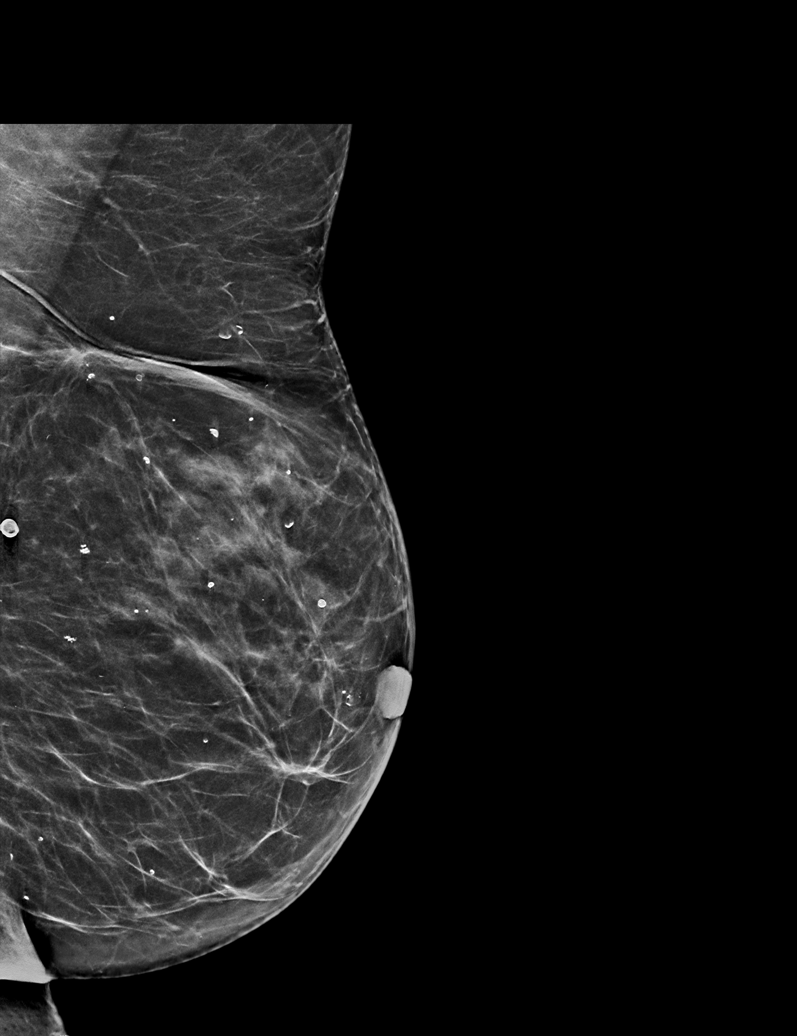

[L CC tomo · tomo slice 27/54.0]
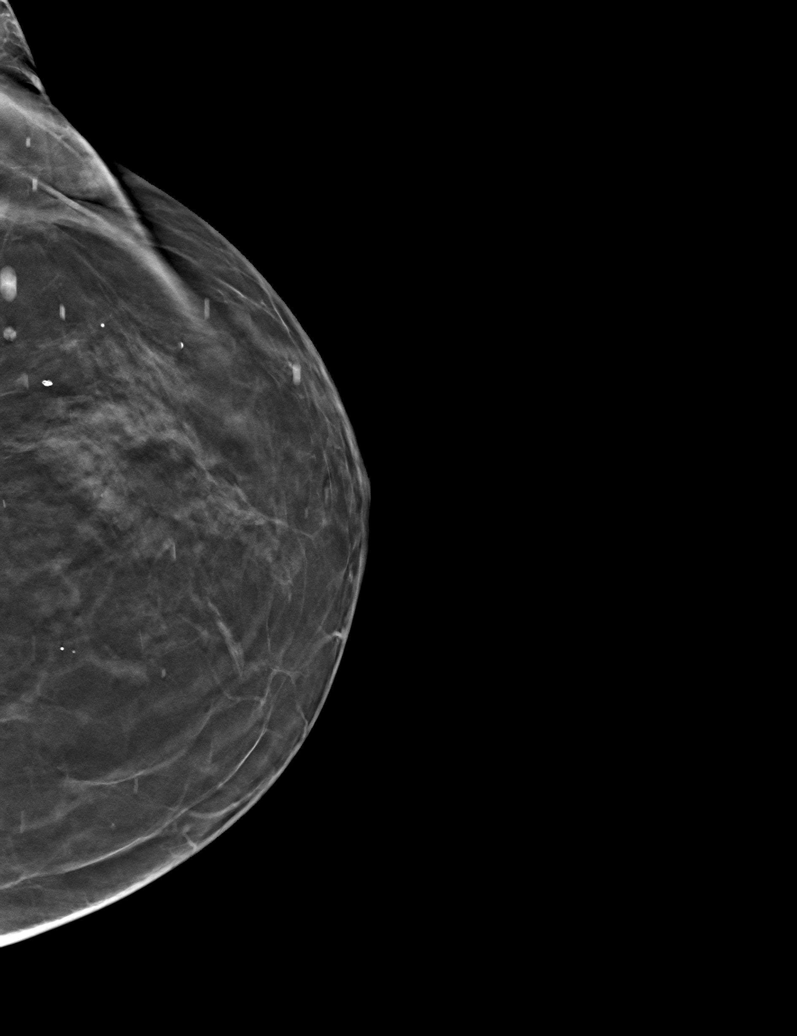

[6 of 30 positions shown; findings below may reference images not displayed]

ACR Breast Density Category b: There are scattered areas of
fibroglandular density.
FINDINGS: There are no findings suspicious for malignancy.
IMPRESSION: No mammographic evidence of malignancy. A result letter of this
screening mammogram will be mailed directly to the patient.

RECOMMENDATION:
Screening mammogram in one year. (Code:51-O-LD2)

BI-RADS CATEGORY  1: Negative.

## 2022-11-16 DIAGNOSIS — Z96641 Presence of right artificial hip joint: Secondary | ICD-10-CM | POA: Diagnosis not present

## 2022-11-19 DIAGNOSIS — M1991 Primary osteoarthritis, unspecified site: Secondary | ICD-10-CM | POA: Diagnosis not present

## 2022-11-19 DIAGNOSIS — L401 Generalized pustular psoriasis: Secondary | ICD-10-CM | POA: Diagnosis not present

## 2022-11-19 DIAGNOSIS — L4050 Arthropathic psoriasis, unspecified: Secondary | ICD-10-CM | POA: Diagnosis not present

## 2022-11-19 DIAGNOSIS — E663 Overweight: Secondary | ICD-10-CM | POA: Diagnosis not present

## 2022-11-19 DIAGNOSIS — M79672 Pain in left foot: Secondary | ICD-10-CM | POA: Diagnosis not present

## 2022-11-19 DIAGNOSIS — Z6829 Body mass index (BMI) 29.0-29.9, adult: Secondary | ICD-10-CM | POA: Diagnosis not present

## 2022-12-17 ENCOUNTER — Encounter: Payer: Self-pay | Admitting: Hematology & Oncology

## 2022-12-17 ENCOUNTER — Inpatient Hospital Stay: Payer: Medicare Other | Admitting: Hematology & Oncology

## 2022-12-17 ENCOUNTER — Encounter: Payer: Self-pay | Admitting: *Deleted

## 2022-12-17 ENCOUNTER — Other Ambulatory Visit: Payer: Self-pay

## 2022-12-17 ENCOUNTER — Inpatient Hospital Stay: Payer: Medicare Other | Attending: Hematology & Oncology

## 2022-12-17 VITALS — BP 140/69 | HR 71 | Temp 99.0°F | Resp 18 | Wt 169.0 lb

## 2022-12-17 DIAGNOSIS — H40013 Open angle with borderline findings, low risk, bilateral: Secondary | ICD-10-CM | POA: Diagnosis not present

## 2022-12-17 DIAGNOSIS — H31092 Other chorioretinal scars, left eye: Secondary | ICD-10-CM | POA: Diagnosis not present

## 2022-12-17 DIAGNOSIS — Z79631 Long term (current) use of antimetabolite agent: Secondary | ICD-10-CM | POA: Insufficient documentation

## 2022-12-17 DIAGNOSIS — Z79811 Long term (current) use of aromatase inhibitors: Secondary | ICD-10-CM | POA: Diagnosis not present

## 2022-12-17 DIAGNOSIS — C50912 Malignant neoplasm of unspecified site of left female breast: Secondary | ICD-10-CM

## 2022-12-17 DIAGNOSIS — C50911 Malignant neoplasm of unspecified site of right female breast: Secondary | ICD-10-CM | POA: Insufficient documentation

## 2022-12-17 DIAGNOSIS — M069 Rheumatoid arthritis, unspecified: Secondary | ICD-10-CM | POA: Diagnosis not present

## 2022-12-17 DIAGNOSIS — M1611 Unilateral primary osteoarthritis, right hip: Secondary | ICD-10-CM

## 2022-12-17 DIAGNOSIS — H04123 Dry eye syndrome of bilateral lacrimal glands: Secondary | ICD-10-CM | POA: Diagnosis not present

## 2022-12-17 DIAGNOSIS — Z923 Personal history of irradiation: Secondary | ICD-10-CM | POA: Insufficient documentation

## 2022-12-17 DIAGNOSIS — H35371 Puckering of macula, right eye: Secondary | ICD-10-CM | POA: Diagnosis not present

## 2022-12-17 DIAGNOSIS — Z170 Estrogen receptor positive status [ER+]: Secondary | ICD-10-CM | POA: Diagnosis not present

## 2022-12-17 DIAGNOSIS — M85851 Other specified disorders of bone density and structure, right thigh: Secondary | ICD-10-CM

## 2022-12-17 LAB — CBC WITH DIFFERENTIAL (CANCER CENTER ONLY)
Abs Immature Granulocytes: 0.05 10*3/uL (ref 0.00–0.07)
Basophils Absolute: 0.1 10*3/uL (ref 0.0–0.1)
Basophils Relative: 1 %
Eosinophils Absolute: 0 10*3/uL (ref 0.0–0.5)
Eosinophils Relative: 0 %
HCT: 43 % (ref 36.0–46.0)
Hemoglobin: 14.2 g/dL (ref 12.0–15.0)
Immature Granulocytes: 1 %
Lymphocytes Relative: 19 %
Lymphs Abs: 1.4 10*3/uL (ref 0.7–4.0)
MCH: 31.3 pg (ref 26.0–34.0)
MCHC: 33 g/dL (ref 30.0–36.0)
MCV: 94.9 fL (ref 80.0–100.0)
Monocytes Absolute: 0.9 10*3/uL (ref 0.1–1.0)
Monocytes Relative: 12 %
Neutro Abs: 5.1 10*3/uL (ref 1.7–7.7)
Neutrophils Relative %: 67 %
Platelet Count: 246 10*3/uL (ref 150–400)
RBC: 4.53 MIL/uL (ref 3.87–5.11)
RDW: 12.5 % (ref 11.5–15.5)
WBC Count: 7.5 10*3/uL (ref 4.0–10.5)
nRBC: 0 % (ref 0.0–0.2)

## 2022-12-17 LAB — LACTATE DEHYDROGENASE: LDH: 196 U/L — ABNORMAL HIGH (ref 98–192)

## 2022-12-17 LAB — CMP (CANCER CENTER ONLY)
ALT: 12 U/L (ref 0–44)
AST: 16 U/L (ref 15–41)
Albumin: 4.1 g/dL (ref 3.5–5.0)
Alkaline Phosphatase: 82 U/L (ref 38–126)
Anion gap: 8 (ref 5–15)
BUN: 13 mg/dL (ref 8–23)
CO2: 27 mmol/L (ref 22–32)
Calcium: 9.4 mg/dL (ref 8.9–10.3)
Chloride: 100 mmol/L (ref 98–111)
Creatinine: 0.58 mg/dL (ref 0.44–1.00)
GFR, Estimated: >60 mL/min (ref >60)
Glucose, Bld: 78 mg/dL (ref 70–99)
Potassium: 4.4 mmol/L (ref 3.5–5.1)
Sodium: 135 mmol/L (ref 135–145)
Total Bilirubin: 0.4 mg/dL (ref <1.2)
Total Protein: 7.3 g/dL (ref 6.5–8.1)

## 2022-12-17 LAB — VITAMIN D 25 HYDROXY (VIT D DEFICIENCY, FRACTURES): Vit D, 25-Hydroxy: 53.94 ng/mL (ref 30–100)

## 2022-12-17 NOTE — Progress Notes (Signed)
Patient doing well on femara. Will continue on the same. Will return in approx 6 months and she lives part time in the [place].   Oncology Nurse Navigator Documentation     12/17/2022   10:30 AM  Oncology Nurse Navigator Flowsheets  Navigator Follow Up Date: 06/17/2023  Navigator Follow Up Reason: Follow-up Appointment  Navigator Location [facility]  Navigator Encounter Type Appt/Treatment Plan Review  Patient Visit Type MedOnc  Treatment Phase Active Tx  Barriers/Navigation Needs No Barriers At This Time  Interventions None Required  Acuity Level 1-No Barriers  Support Groups/Services Friends and Family  Time Spent with Patient 15

## 2022-12-17 NOTE — Progress Notes (Signed)
Hematology and Oncology Follow Up Visit  GIAVANA GATH [id number] 03-12-1945 77 y.o. 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 daily-*on 05/25/2022   Interim History:  Ms. Lauren [name] is here today for follow-up.  We last saw her back in May.  She is doing quite well.  She and her husband always go down to the [place] for the summer.  They be heading back there in January for 4 months..  She is doing well on the Femara.  She has had no problems with arthralgias or myalgias.  She continues on methotrexate weekly for her rheumatoid arthritis.  She has had no fever.  There has been no issues with COVID.Marland Kitchen  She has had no change in bowel or bladder habits.  She has had no rashes.  She has had no cough or shortness of breath.  Has been no nausea or vomiting.  Overall, I would have to say that her performance status is ECOG 0.    Medications:  Allergies as of 12/17/2022   No Known Allergies      Medication List        Accurate as of December 17, 2022 11:20 AM. If you have any questions, ask your nurse or doctor.          acetaminophen 500 MG tablet Commonly known as: TYLENOL Take 1,000 mg by mouth every 6 (six) hours as needed for moderate pain.   Calcium 600+D 600-800 MG-UNIT Tabs Generic drug: Calcium Carb-Cholecalciferol Take 1 tablet by mouth every evening.   COSAMIN DS PO Take 1 tablet by mouth in the morning and at bedtime. 1500-1200   diclofenac Sodium 1 % Gel Commonly known as: VOLTAREN Apply 1 Application topically 2 (two) times daily as needed (joint pain).   ezetimibe 10 MG tablet Commonly known as: ZETIA Take 10 mg by mouth daily. Patient trying every other day due to leg cramping   folic acid 1 MG tablet Commonly  known as: FOLVITE Take 1 mg by mouth daily.   latanoprost 0.005 % ophthalmic solution Commonly known as: XALATAN Place 1 drop into both eyes at bedtime.   letrozole 2.5 MG tablet Commonly known as: FEMARA Take 1 tablet (2.5 mg total) by mouth daily.   Magnesium 500 MG Tabs Take 500 mg by mouth daily in the afternoon.   methotrexate 2.5 MG tablet Commonly known as: RHEUMATREX Take 15 mg by mouth once a week.   naproxen 250 MG tablet Commonly known as: NAPROSYN Take 250 mg by mouth daily as needed.   Synthroid 112 MCG tablet Generic drug: levothyroxine Take 112 mcg by mouth daily before breakfast.   timolol 0.5 % ophthalmic solution Commonly known as: TIMOPTIC Place 1 drop into both eyes daily.   Vitamin B-12 2500 MCG Subl Take 2,500 mcg by mouth every evening.        Allergies: No Known Allergies  Past Medical History, Surgical history, Social history, and Family History were reviewed and updated.  Review of Systems: Review of Systems  Constitutional: Negative.   HENT: Negative.    Eyes: Negative.   Respiratory: Negative.    Cardiovascular: Negative.   Gastrointestinal: Negative.   Genitourinary: Negative.   Musculoskeletal: Negative.   Skin: Negative.   Neurological: Negative.   Endo/Heme/Allergies: Negative.  Psychiatric/Behavioral: Negative.        Physical Exam:  weight is 169 lb (76.7 kg). Her oral temperature is 99 F (37.2 C). Her blood pressure is 140/69 (abnormal) and her pulse is 71. Her respiration is 18 and oxygen saturation is 100%.   Wt Readings from Last 3 Encounters:  12/17/22 169 lb (76.7 kg)  05/21/22 168 lb (76.2 kg)  04/22/22 166 lb 1.9 oz (75.4 kg)    Physical Exam Vitals reviewed.  Constitutional:      Comments: Her breast exam shows left breast with a well-healed lumpectomy scar at about the 1 o'clock position.  There is no left axillary adenopathy.  Her right breast shows a well healed lumpectomy scar at about the 10  o'clock position.  There is some slight erythema on the right breast.  There is no right nipple discharge.  There is no right axillary adenopathy.  HENT:     Head: Normocephalic and atraumatic.  Eyes:     Pupils: Pupils are equal, round, and reactive to light.  Cardiovascular:     Rate and Rhythm: Normal rate and regular rhythm.     Heart sounds: Normal heart sounds.  Pulmonary:     Effort: Pulmonary effort is normal.     Breath sounds: Normal breath sounds.  Abdominal:     General: Bowel sounds are normal.     Palpations: Abdomen is soft.  Musculoskeletal:        General: No tenderness or deformity. Normal range of motion.     Cervical back: Normal range of motion.  Lymphadenopathy:     Cervical: No cervical adenopathy.  Skin:    General: Skin is warm and dry.     Findings: No erythema or rash.  Neurological:     Mental Status: She is alert and oriented to person, place, and time.  Psychiatric:        Behavior: Behavior normal.        Thought Content: Thought content normal.        Judgment: Judgment normal.      Lab Results  Component Value Date   WBC 7.5 12/17/2022   HGB 14.2 12/17/2022   HCT 43.0 12/17/2022   MCV 94.9 12/17/2022   PLT 246 12/17/2022   No results found for: "FERRITIN", "IRON", "TIBC", "UIBC", "IRONPCTSAT" Lab Results  Component Value Date   RBC 4.53 12/17/2022   No results found for: "KPAFRELGTCHN", "LAMBDASER", "KAPLAMBRATIO" No results found for: "IGGSERUM", "IGA", "IGMSERUM" No results found for: "TOTALPROTELP", "ALBUMINELP", "A1GS", "A2GS", "BETS", "BETA2SER", "GAMS", "MSPIKE", "SPEI"   Chemistry      Component Value Date/Time   NA 135 12/17/2022 0950   NA 143 11/02/2016 1335   NA 141 08/30/2015 1149   K 4.4 12/17/2022 0950   K 4.4 11/02/2016 1335   K 4.2 08/30/2015 1149   CL 100 12/17/2022 0950   CL 104 11/02/2016 1335   CO2 27 12/17/2022 0950   CO2 27 11/02/2016 1335   CO2 27 08/30/2015 1149   BUN 13 12/17/2022 0950   BUN 11  11/02/2016 1335   BUN 16.1 08/30/2015 1149   CREATININE 0.58 12/17/2022 0950   CREATININE 0.9 11/02/2016 1335   CREATININE 0.6 08/30/2015 1149      Component Value Date/Time   CALCIUM 9.4 12/17/2022 0950   CALCIUM 9.2 11/02/2016 1335   CALCIUM 9.3 08/30/2015 1149   ALKPHOS 82 12/17/2022 0950   ALKPHOS 94 (H) 11/02/2016 1335   ALKPHOS 110 08/30/2015 1149   AST  16 12/17/2022 0950   AST 26 08/30/2015 1149   ALT 12 12/17/2022 0950   ALT 25 11/02/2016 1335   ALT 33 08/30/2015 1149   BILITOT 0.4 12/17/2022 0950   BILITOT 0.55 08/30/2015 1149       Impression and Plan: Ms. Mclerran is a pleasant postmenopausal 77 yo caucasian female with history of stage I lobular carcinoma of the left breast treated with lumpectomy in 2004 and most recently infiltrating ductal carcinoma of the right breast, ER/PR +/HER2 - .   She had her lumpectomy on 03/02/2022.   She completed her radiation therapy.  She did well with this.  We will now have her on Femara.  She and her husband are headed back to the [place] in January.  They be there for 4 months.  As always, we will see her back in 6 months.   Josph Macho, MD 12/5/202411:20 AM

## 2022-12-23 DIAGNOSIS — D225 Melanocytic nevi of trunk: Secondary | ICD-10-CM | POA: Diagnosis not present

## 2022-12-23 DIAGNOSIS — Z1283 Encounter for screening for malignant neoplasm of skin: Secondary | ICD-10-CM | POA: Diagnosis not present

## 2022-12-23 DIAGNOSIS — L400 Psoriasis vulgaris: Secondary | ICD-10-CM | POA: Diagnosis not present

## 2023-01-01 DIAGNOSIS — E039 Hypothyroidism, unspecified: Secondary | ICD-10-CM | POA: Diagnosis not present

## 2023-01-01 DIAGNOSIS — E785 Hyperlipidemia, unspecified: Secondary | ICD-10-CM | POA: Diagnosis not present

## 2023-01-01 DIAGNOSIS — Z79899 Other long term (current) drug therapy: Secondary | ICD-10-CM | POA: Diagnosis not present

## 2023-01-01 DIAGNOSIS — C50912 Malignant neoplasm of unspecified site of left female breast: Secondary | ICD-10-CM | POA: Diagnosis not present

## 2023-01-11 DIAGNOSIS — L4050 Arthropathic psoriasis, unspecified: Secondary | ICD-10-CM | POA: Diagnosis not present

## 2023-01-12 DIAGNOSIS — Z853 Personal history of malignant neoplasm of breast: Secondary | ICD-10-CM | POA: Diagnosis not present

## 2023-01-12 DIAGNOSIS — E039 Hypothyroidism, unspecified: Secondary | ICD-10-CM | POA: Diagnosis not present

## 2023-01-12 DIAGNOSIS — L4052 Psoriatic arthritis mutilans: Secondary | ICD-10-CM | POA: Diagnosis not present

## 2023-01-12 DIAGNOSIS — Z0000 Encounter for general adult medical examination without abnormal findings: Secondary | ICD-10-CM | POA: Diagnosis not present

## 2023-01-12 DIAGNOSIS — E785 Hyperlipidemia, unspecified: Secondary | ICD-10-CM | POA: Diagnosis not present

## 2023-01-12 DIAGNOSIS — Z23 Encounter for immunization: Secondary | ICD-10-CM | POA: Diagnosis not present

## 2023-01-14 DIAGNOSIS — H40013 Open angle with borderline findings, low risk, bilateral: Secondary | ICD-10-CM | POA: Diagnosis not present

## 2023-05-10 ENCOUNTER — Encounter (HOSPITAL_COMMUNITY): Payer: Self-pay | Admitting: Internal Medicine

## 2023-05-10 ENCOUNTER — Other Ambulatory Visit (HOSPITAL_COMMUNITY): Payer: Self-pay | Admitting: Internal Medicine

## 2023-05-10 DIAGNOSIS — R928 Other abnormal and inconclusive findings on diagnostic imaging of breast: Secondary | ICD-10-CM

## 2023-05-10 DIAGNOSIS — Z9889 Other specified postprocedural states: Secondary | ICD-10-CM

## 2023-05-10 DIAGNOSIS — N63 Unspecified lump in unspecified breast: Secondary | ICD-10-CM

## 2023-05-18 DIAGNOSIS — Z96641 Presence of right artificial hip joint: Secondary | ICD-10-CM | POA: Diagnosis not present

## 2023-05-18 DIAGNOSIS — M7061 Trochanteric bursitis, right hip: Secondary | ICD-10-CM | POA: Diagnosis not present

## 2023-05-20 ENCOUNTER — Encounter (HOSPITAL_COMMUNITY): Payer: Self-pay

## 2023-05-20 DIAGNOSIS — M79672 Pain in left foot: Secondary | ICD-10-CM | POA: Diagnosis not present

## 2023-05-20 DIAGNOSIS — L4050 Arthropathic psoriasis, unspecified: Secondary | ICD-10-CM | POA: Diagnosis not present

## 2023-05-20 DIAGNOSIS — M1991 Primary osteoarthritis, unspecified site: Secondary | ICD-10-CM | POA: Diagnosis not present

## 2023-05-20 DIAGNOSIS — E663 Overweight: Secondary | ICD-10-CM | POA: Diagnosis not present

## 2023-05-20 DIAGNOSIS — L401 Generalized pustular psoriasis: Secondary | ICD-10-CM | POA: Diagnosis not present

## 2023-05-20 DIAGNOSIS — Z6829 Body mass index (BMI) 29.0-29.9, adult: Secondary | ICD-10-CM | POA: Diagnosis not present

## 2023-05-27 ENCOUNTER — Ambulatory Visit (HOSPITAL_COMMUNITY)
Admission: RE | Admit: 2023-05-27 | Discharge: 2023-05-27 | Disposition: A | Discharge status: Home / Self Care | Source: Ambulatory Visit | Attending: Internal Medicine | Admitting: Internal Medicine

## 2023-05-27 ENCOUNTER — Encounter (HOSPITAL_COMMUNITY): Payer: Self-pay

## 2023-05-27 DIAGNOSIS — R928 Other abnormal and inconclusive findings on diagnostic imaging of breast: Secondary | ICD-10-CM

## 2023-05-27 DIAGNOSIS — R92323 Mammographic fibroglandular density, bilateral breasts: Secondary | ICD-10-CM | POA: Diagnosis not present

## 2023-06-02 DIAGNOSIS — M7061 Trochanteric bursitis, right hip: Secondary | ICD-10-CM | POA: Diagnosis not present

## 2023-06-10 DIAGNOSIS — M1712 Unilateral primary osteoarthritis, left knee: Secondary | ICD-10-CM | POA: Diagnosis not present

## 2023-06-10 DIAGNOSIS — M25562 Pain in left knee: Secondary | ICD-10-CM | POA: Diagnosis not present

## 2023-06-10 DIAGNOSIS — L4050 Arthropathic psoriasis, unspecified: Secondary | ICD-10-CM | POA: Diagnosis not present

## 2023-06-17 ENCOUNTER — Encounter: Payer: Self-pay | Admitting: *Deleted

## 2023-06-17 ENCOUNTER — Inpatient Hospital Stay: Attending: Hematology & Oncology

## 2023-06-17 ENCOUNTER — Encounter: Payer: Self-pay | Admitting: Hematology & Oncology

## 2023-06-17 ENCOUNTER — Ambulatory Visit: Payer: Medicare Other | Admitting: Hematology & Oncology

## 2023-06-17 ENCOUNTER — Inpatient Hospital Stay (HOSPITAL_BASED_OUTPATIENT_CLINIC_OR_DEPARTMENT_OTHER): Admitting: Hematology & Oncology

## 2023-06-17 ENCOUNTER — Inpatient Hospital Stay: Payer: Medicare Other

## 2023-06-17 VITALS — BP 136/78 | HR 84 | Temp 98.9°F | Resp 20 | Ht 61.0 in | Wt 166.0 lb

## 2023-06-17 DIAGNOSIS — C50912 Malignant neoplasm of unspecified site of left female breast: Secondary | ICD-10-CM

## 2023-06-17 DIAGNOSIS — Z79811 Long term (current) use of aromatase inhibitors: Secondary | ICD-10-CM | POA: Insufficient documentation

## 2023-06-17 DIAGNOSIS — F17210 Nicotine dependence, cigarettes, uncomplicated: Secondary | ICD-10-CM | POA: Diagnosis not present

## 2023-06-17 DIAGNOSIS — C50911 Malignant neoplasm of unspecified site of right female breast: Secondary | ICD-10-CM | POA: Insufficient documentation

## 2023-06-17 DIAGNOSIS — Z170 Estrogen receptor positive status [ER+]: Secondary | ICD-10-CM | POA: Diagnosis not present

## 2023-06-17 DIAGNOSIS — Z1732 Human epidermal growth factor receptor 2 negative status: Secondary | ICD-10-CM | POA: Insufficient documentation

## 2023-06-17 DIAGNOSIS — Z1721 Progesterone receptor positive status: Secondary | ICD-10-CM | POA: Diagnosis not present

## 2023-06-17 DIAGNOSIS — Z923 Personal history of irradiation: Secondary | ICD-10-CM | POA: Diagnosis not present

## 2023-06-17 LAB — CBC WITH DIFFERENTIAL (CANCER CENTER ONLY)
Abs Immature Granulocytes: 0.04 10*3/uL (ref 0.00–0.07)
Basophils Absolute: 0 10*3/uL (ref 0.0–0.1)
Basophils Relative: 1 %
Eosinophils Absolute: 0.2 10*3/uL (ref 0.0–0.5)
Eosinophils Relative: 2 %
HCT: 39.6 % (ref 36.0–46.0)
Hemoglobin: 13.3 g/dL (ref 12.0–15.0)
Immature Granulocytes: 1 %
Lymphocytes Relative: 25 %
Lymphs Abs: 2 10*3/uL (ref 0.7–4.0)
MCH: 30.9 pg (ref 26.0–34.0)
MCHC: 33.6 g/dL (ref 30.0–36.0)
MCV: 91.9 fL (ref 80.0–100.0)
Monocytes Absolute: 0.9 10*3/uL (ref 0.1–1.0)
Monocytes Relative: 11 %
Neutro Abs: 4.9 10*3/uL (ref 1.7–7.7)
Neutrophils Relative %: 60 %
Platelet Count: 263 10*3/uL (ref 150–400)
RBC: 4.31 MIL/uL (ref 3.87–5.11)
RDW: 13 % (ref 11.5–15.5)
WBC Count: 7.8 10*3/uL (ref 4.0–10.5)
nRBC: 0 % (ref 0.0–0.2)

## 2023-06-17 LAB — CMP (CANCER CENTER ONLY)
ALT: 17 U/L (ref 0–44)
AST: 18 U/L (ref 15–41)
Albumin: 4.3 g/dL (ref 3.5–5.0)
Alkaline Phosphatase: 87 U/L (ref 38–126)
Anion gap: 8 (ref 5–15)
BUN: 17 mg/dL (ref 8–23)
CO2: 27 mmol/L (ref 22–32)
Calcium: 9.1 mg/dL (ref 8.9–10.3)
Chloride: 95 mmol/L — ABNORMAL LOW (ref 98–111)
Creatinine: 0.57 mg/dL (ref 0.44–1.00)
GFR, Estimated: >60 mL/min (ref >60)
Glucose, Bld: 102 mg/dL — ABNORMAL HIGH (ref 70–99)
Potassium: 4.5 mmol/L (ref 3.5–5.1)
Sodium: 130 mmol/L — ABNORMAL LOW (ref 135–145)
Total Bilirubin: 0.4 mg/dL (ref 0.0–1.2)
Total Protein: 7.5 g/dL (ref 6.5–8.1)

## 2023-06-17 NOTE — Progress Notes (Signed)
 Hematology and Oncology Follow Up Visit  Lauren [name] [id number] 09/19/1945 78 y.o. 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 daily-*on 05/25/2022   Interim History:  Lauren [name] is here today for follow-up.  She would be heading back down to her [place] house on July 4.  They will be down there for 4 months.  She tells me all about what goes on down there.  It sounds so exciting.  I know that she and her husband really enjoyed themselves down there.  She is doing quite well.  She is on Femara .  She has had no problems with Femara .  There is been no bony pain.  She has had no hot flashes.  She has had no nausea or vomiting.  There is been no rashes.  She has had no leg swelling.  She has had no issues with COVID or Influenza.  She still is trying to stay active.  She does get a lot of exercise done in the vomitus.  Overall, I would have to say that her performance status is probably ECOG 1.   Medications:  Allergies as of 06/17/2023   No Known Allergies      Medication List        Accurate as of June 17, 2023  2:52 PM. If you have any questions, ask your nurse or doctor.          acetaminophen  500 MG tablet Commonly known as: TYLENOL  Take 1,000 mg by mouth every 6 (six) hours as needed for moderate pain.   betamethasone dipropionate 0.05 % cream Apply topically 2 (two) times daily as needed.   Calcium 600+D 600-800 MG-UNIT Tabs Generic drug: Calcium Carb-Cholecalciferol Take 1 tablet by mouth every evening.   COSAMIN DS PO Take 1 tablet by mouth in the morning and at bedtime. 1500-1200   diclofenac Sodium 1 % Gel Commonly known as: VOLTAREN Apply 1 Application topically 2 (two) times daily as needed (joint pain).    ezetimibe 10 MG tablet Commonly known as: ZETIA Take 10 mg by mouth daily.   folic acid 1 MG tablet Commonly known as: FOLVITE Take 1 mg by mouth daily.   latanoprost 0.005 % ophthalmic solution Commonly known as: XALATAN Place 1 drop into both eyes at bedtime.   letrozole  2.5 MG tablet Commonly known as: FEMARA  Take 1 tablet (2.5 mg total) by mouth daily.   Magnesium 500 MG Tabs Take 500 mg by mouth daily in the afternoon.   methotrexate 2.5 MG tablet Commonly known as: RHEUMATREX Take 15 mg by mouth once a week.   naproxen 250 MG tablet Commonly known as: NAPROSYN Take 250 mg by mouth daily as needed.   pantoprazole  40 MG tablet Commonly known as: PROTONIX  Take 40 mg by mouth 2 (two) times daily as needed.   Synthroid 112 MCG tablet Generic drug: levothyroxine Take 112 mcg by mouth daily before breakfast.   timolol 0.5 % ophthalmic solution Commonly known as: TIMOPTIC Place 1 drop into both eyes daily.   Vitamin B-12 2500 MCG Subl Take 2,500 mcg by mouth every evening.        Allergies: No Known Allergies  Past Medical History, Surgical history, Social history, and Family History were reviewed and updated.  Review of Systems:  Review of Systems  Constitutional: Negative.   HENT: Negative.    Eyes: Negative.   Respiratory: Negative.    Cardiovascular: Negative.   Gastrointestinal: Negative.   Genitourinary: Negative.   Musculoskeletal: Negative.   Skin: Negative.   Neurological: Negative.   Endo/Heme/Allergies: Negative.   Psychiatric/Behavioral: Negative.        Physical Exam:  height is 5\' 1"  (1.549 m) and weight is 166 lb (75.3 kg). Her oral temperature is 98.9 F (37.2 C). Her blood pressure is 136/78 and her pulse is 84. Her respiration is 20 and oxygen saturation is 95%.   Wt Readings from Last 3 Encounters:  06/17/23 166 lb (75.3 kg)  12/17/22 169 lb (76.7 kg)  05/21/22 168 lb (76.2 kg)    Physical Exam Vitals reviewed.   Constitutional:      Comments: Her breast exam shows left breast with a well-healed lumpectomy scar at about the 1 o'clock position.  There is no left axillary adenopathy.  Her right breast shows a well healed lumpectomy scar at about the 10 o'clock position.  There is some slight erythema on the right breast.  There is no right nipple discharge.  There is no right axillary adenopathy.  HENT:     Head: Normocephalic and atraumatic.  Eyes:     Pupils: Pupils are equal, round, and reactive to light.  Cardiovascular:     Rate and Rhythm: Normal rate and regular rhythm.     Heart sounds: Normal heart sounds.  Pulmonary:     Effort: Pulmonary effort is normal.     Breath sounds: Normal breath sounds.  Abdominal:     General: Bowel sounds are normal.     Palpations: Abdomen is soft.  Musculoskeletal:        General: No tenderness or deformity. Normal range of motion.     Cervical back: Normal range of motion.  Lymphadenopathy:     Cervical: No cervical adenopathy.  Skin:    General: Skin is warm and dry.     Findings: No erythema or rash.  Neurological:     Mental Status: She is alert and oriented to person, place, and time.  Psychiatric:        Behavior: Behavior normal.        Thought Content: Thought content normal.        Judgment: Judgment normal.      Lab Results  Component Value Date   WBC 7.8 06/17/2023   HGB 13.3 06/17/2023   HCT 39.6 06/17/2023   MCV 91.9 06/17/2023   PLT 263 06/17/2023   No results found for: "FERRITIN", "IRON", "TIBC", "UIBC", "IRONPCTSAT" Lab Results  Component Value Date   RBC 4.31 06/17/2023   No results found for: "KPAFRELGTCHN", "LAMBDASER", "KAPLAMBRATIO" No results found for: "IGGSERUM", "IGA", "IGMSERUM" No results found for: "TOTALPROTELP", "ALBUMINELP", "A1GS", "A2GS", "BETS", "BETA2SER", "GAMS", "MSPIKE", "SPEI"   Chemistry      Component Value Date/Time   NA 130 (L) 06/17/2023 1339   NA 143 11/02/2016 1335   NA 141 08/30/2015  1149   K 4.5 06/17/2023 1339   K 4.4 11/02/2016 1335   K 4.2 08/30/2015 1149   CL 95 (L) 06/17/2023 1339   CL 104 11/02/2016 1335   CO2 27 06/17/2023 1339   CO2 27 11/02/2016 1335   CO2 27 08/30/2015 1149   BUN 17 06/17/2023 1339   BUN 11 11/02/2016 1335   BUN 16.1 08/30/2015 1149   CREATININE 0.57 06/17/2023 1339   CREATININE 0.9  11/02/2016 1335   CREATININE 0.6 08/30/2015 1149      Component Value Date/Time   CALCIUM 9.1 06/17/2023 1339   CALCIUM 9.2 11/02/2016 1335   CALCIUM 9.3 08/30/2015 1149   ALKPHOS 87 06/17/2023 1339   ALKPHOS 94 (H) 11/02/2016 1335   ALKPHOS 110 08/30/2015 1149   AST 18 06/17/2023 1339   AST 26 08/30/2015 1149   ALT 17 06/17/2023 1339   ALT 25 11/02/2016 1335   ALT 33 08/30/2015 1149   BILITOT 0.4 06/17/2023 1339   BILITOT 0.55 08/30/2015 1149       Impression and Plan: Ms. Ferger is a pleasant postmenopausal 78 yo caucasian female with history of stage I lobular carcinoma of the left breast treated with lumpectomy in 2004 and most recently infiltrating ductal carcinoma of the right breast, ER/PR +/HER2 - .   She had her lumpectomy on 03/02/2022.   She completed her radiation therapy.  She did well with this.  We will now have her on Femara .  I will plan to get her back in December.  She will have come back from the [place] at that point.    Ivor Mars, MD 6/5/20252:52 PM

## 2023-06-17 NOTE — Progress Notes (Signed)
 Patient will continue on AI. Will discontinue active navigation at this time but be available as needed in the future.   Oncology Nurse Navigator Documentation     06/17/2023    2:00 PM  Oncology Nurse Navigator Flowsheets  Navigation Complete Date: 06/17/2023  Post Navigation: Continue to Follow Patient? No  Reason Not Navigating Patient: Patient On Maintenance Chemotherapy  Navigator Location [facility]  Navigator Encounter Type Appt/Treatment Plan Review  Patient Visit Type MedOnc  Treatment Phase Active Tx  Barriers/Navigation Needs No Barriers At This Time  Interventions None Required  Acuity Level 1-No Barriers  Support Groups/Services Friends and Family  Time Spent with Patient 15

## 2023-06-18 NOTE — Addendum Note (Signed)
 Addended by: Ivor Mars on: 06/18/2023 03:32 PM   Modules accepted: Orders

## 2023-07-01 DIAGNOSIS — H40013 Open angle with borderline findings, low risk, bilateral: Secondary | ICD-10-CM | POA: Diagnosis not present

## 2023-07-01 DIAGNOSIS — H35371 Puckering of macula, right eye: Secondary | ICD-10-CM | POA: Diagnosis not present

## 2023-07-01 DIAGNOSIS — Z961 Presence of intraocular lens: Secondary | ICD-10-CM | POA: Diagnosis not present

## 2023-07-06 ENCOUNTER — Other Ambulatory Visit: Payer: Self-pay | Admitting: *Deleted

## 2023-07-06 MED ORDER — LETROZOLE 2.5 MG PO TABS: 2.5000 mg | ORAL_TABLET | Freq: Every day | ORAL | 4 refills | Status: AC

## 2023-07-12 DIAGNOSIS — L4050 Arthropathic psoriasis, unspecified: Secondary | ICD-10-CM | POA: Diagnosis not present

## 2023-11-25 DIAGNOSIS — Z6829 Body mass index (BMI) 29.0-29.9, adult: Secondary | ICD-10-CM | POA: Diagnosis not present

## 2023-11-25 DIAGNOSIS — E663 Overweight: Secondary | ICD-10-CM | POA: Diagnosis not present

## 2023-11-25 DIAGNOSIS — M1991 Primary osteoarthritis, unspecified site: Secondary | ICD-10-CM | POA: Diagnosis not present

## 2023-11-25 DIAGNOSIS — M79672 Pain in left foot: Secondary | ICD-10-CM | POA: Diagnosis not present

## 2023-11-25 DIAGNOSIS — L4050 Arthropathic psoriasis, unspecified: Secondary | ICD-10-CM | POA: Diagnosis not present

## 2023-11-25 DIAGNOSIS — L401 Generalized pustular psoriasis: Secondary | ICD-10-CM | POA: Diagnosis not present

## 2023-12-01 DIAGNOSIS — Z23 Encounter for immunization: Secondary | ICD-10-CM | POA: Diagnosis not present

## 2023-12-16 ENCOUNTER — Encounter: Payer: Self-pay | Admitting: Hematology & Oncology

## 2023-12-16 ENCOUNTER — Other Ambulatory Visit: Payer: Self-pay

## 2023-12-16 ENCOUNTER — Inpatient Hospital Stay: Attending: Hematology & Oncology

## 2023-12-16 ENCOUNTER — Inpatient Hospital Stay: Admitting: Hematology & Oncology

## 2023-12-16 VITALS — BP 126/82 | HR 78 | Temp 98.2°F | Resp 18 | Wt 167.0 lb

## 2023-12-16 DIAGNOSIS — Z170 Estrogen receptor positive status [ER+]: Secondary | ICD-10-CM | POA: Diagnosis not present

## 2023-12-16 DIAGNOSIS — C50911 Malignant neoplasm of unspecified site of right female breast: Secondary | ICD-10-CM | POA: Insufficient documentation

## 2023-12-16 DIAGNOSIS — C50912 Malignant neoplasm of unspecified site of left female breast: Secondary | ICD-10-CM | POA: Diagnosis not present

## 2023-12-16 DIAGNOSIS — Z1721 Progesterone receptor positive status: Secondary | ICD-10-CM | POA: Insufficient documentation

## 2023-12-16 DIAGNOSIS — Z1732 Human epidermal growth factor receptor 2 negative status: Secondary | ICD-10-CM | POA: Diagnosis not present

## 2023-12-16 DIAGNOSIS — Z79811 Long term (current) use of aromatase inhibitors: Secondary | ICD-10-CM | POA: Diagnosis not present

## 2023-12-16 DIAGNOSIS — Z923 Personal history of irradiation: Secondary | ICD-10-CM | POA: Diagnosis not present

## 2023-12-16 LAB — CBC WITH DIFFERENTIAL (CANCER CENTER ONLY)
Abs Immature Granulocytes: 0.04 K/uL (ref 0.00–0.07)
Basophils Absolute: 0.1 K/uL (ref 0.0–0.1)
Basophils Relative: 1 %
Eosinophils Absolute: 0 K/uL (ref 0.0–0.5)
Eosinophils Relative: 0 %
HCT: 41.6 % (ref 36.0–46.0)
Hemoglobin: 13.9 g/dL (ref 12.0–15.0)
Immature Granulocytes: 0 %
Lymphocytes Relative: 19 %
Lymphs Abs: 1.7 K/uL (ref 0.7–4.0)
MCH: 31 pg (ref 26.0–34.0)
MCHC: 33.4 g/dL (ref 30.0–36.0)
MCV: 92.7 fL (ref 80.0–100.0)
Monocytes Absolute: 1 K/uL (ref 0.1–1.0)
Monocytes Relative: 11 %
Neutro Abs: 6.4 K/uL (ref 1.7–7.7)
Neutrophils Relative %: 69 %
Platelet Count: 282 K/uL (ref 150–400)
RBC: 4.49 MIL/uL (ref 3.87–5.11)
RDW: 12.8 % (ref 11.5–15.5)
WBC Count: 9.2 K/uL (ref 4.0–10.5)
nRBC: 0 % (ref 0.0–0.2)

## 2023-12-16 LAB — CMP (CANCER CENTER ONLY)
ALT: 19 U/L (ref 0–44)
AST: 21 U/L (ref 15–41)
Albumin: 4.3 g/dL (ref 3.5–5.0)
Alkaline Phosphatase: 104 U/L (ref 38–126)
Anion gap: 12 (ref 5–15)
BUN: 13 mg/dL (ref 8–23)
CO2: 25 mmol/L (ref 22–32)
Calcium: 9.2 mg/dL (ref 8.9–10.3)
Chloride: 100 mmol/L (ref 98–111)
Creatinine: 0.65 mg/dL (ref 0.44–1.00)
GFR, Estimated: >60 mL/min (ref >60)
Glucose, Bld: 86 mg/dL (ref 70–99)
Potassium: 4.7 mmol/L (ref 3.5–5.1)
Sodium: 137 mmol/L (ref 135–145)
Total Bilirubin: 0.4 mg/dL (ref 0.0–1.2)
Total Protein: 7.4 g/dL (ref 6.5–8.1)

## 2023-12-16 NOTE — Progress Notes (Signed)
 Hematology and Oncology Follow Up Visit  Lauren [name] [id number] 1945/02/29 78 y.o. 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 daily-started on 05/25/2022   Interim History:  Lauren [name] is here today for follow-up.  She would be heading back down to her [place] house on January 5.  They will go down for 4 months.  Since was, they were down in the [place].  As always, have a great time.  She wears a lot of sunscreen.  The lot of great seafood that they catch that day.  Otherwise, she really has done well.  She has had no problems with the Femara .  She has had no hot flashes.  She has had no bony aches or pains.  She has had no change in bowel or bladder habits.  She has had no bleeding.  There has been no cough or shortness of breath.  Currently, I will say that her performance status is probably Lauren [name] 1.     Medications:  Allergies as of 12/16/2023   No Known Allergies      Medication List        Accurate as of December 16, 2023 12:47 PM. If you have any questions, ask your nurse or doctor.          acetaminophen  500 MG tablet Commonly known as: TYLENOL  Take 1,000 mg by mouth every 6 (six) hours as needed for moderate pain.   betamethasone dipropionate 0.05 % cream Apply topically 2 (two) times daily as needed.   Calcium 600+D 600-800 MG-UNIT Tabs Generic drug: Calcium Carb-Cholecalciferol Take 1 tablet by mouth every evening.   COSAMIN DS PO Take 1 tablet by mouth in the morning and at bedtime. 1500-1200   diclofenac Sodium 1 % Gel Commonly known as: VOLTAREN Apply 1 Application topically 2 (two) times daily as needed (joint pain).   ezetimibe 10 MG tablet Commonly known as: ZETIA Take 10 mg by mouth daily.    folic acid 1 MG tablet Commonly known as: FOLVITE Take 1 mg by mouth daily.   latanoprost 0.005 % ophthalmic solution Commonly known as: XALATAN Place 1 drop into both eyes at bedtime.   letrozole  2.5 MG tablet Commonly known as: FEMARA  Take 1 tablet (2.5 mg total) by mouth daily.   Magnesium 500 MG Tabs Take 500 mg by mouth daily in the afternoon.   methotrexate 2.5 MG tablet Commonly known as: RHEUMATREX Take 15 mg by mouth once a week.   naproxen 250 MG tablet Commonly known as: NAPROSYN Take 250 mg by mouth daily as needed.   pantoprazole  40 MG tablet Commonly known as: PROTONIX  Take 40 mg by mouth 2 (two) times daily as needed.   Synthroid 112 MCG tablet Generic drug: levothyroxine Take 112 mcg by mouth daily before breakfast.   timolol 0.5 % ophthalmic solution Commonly known as: TIMOPTIC Place 1 drop into both eyes daily.   Vitamin B-12 2500 MCG Subl Take 2,500 mcg by mouth every evening.        Allergies: No Known Allergies  Past Medical History, Surgical history, Social history, and Family History were reviewed and updated.  Review of Systems: Review of Systems  Constitutional: Negative.   HENT: Negative.    Eyes: Negative.   Respiratory: Negative.  Cardiovascular: Negative.   Gastrointestinal: Negative.   Genitourinary: Negative.   Musculoskeletal: Negative.   Skin: Negative.   Neurological: Negative.   Endo/Heme/Allergies: Negative.   Psychiatric/Behavioral: Negative.        Physical Exam:  weight is 167 lb (75.8 kg). Her oral temperature is 98.2 F (36.8 C). Her blood pressure is 126/82 and her pulse is 78. Her respiration is 18 and oxygen saturation is 97%.   Wt Readings from Last 3 Encounters:  12/16/23 167 lb (75.8 kg)  06/17/23 166 lb (75.3 kg)  12/17/22 169 lb (76.7 kg)    Physical Exam Vitals reviewed.  Constitutional:      Comments: Her breast exam shows left breast with a well-healed lumpectomy scar at about the 1  o'clock position.  There is no left axillary adenopathy.  Her right breast shows a well healed lumpectomy scar at about the 10 o'clock position.  There is some slight erythema on the right breast.  There is no right nipple discharge.  There is no right axillary adenopathy.  HENT:     Head: Normocephalic and atraumatic.  Eyes:     Pupils: Pupils are equal, round, and reactive to light.  Cardiovascular:     Rate and Rhythm: Normal rate and regular rhythm.     Heart sounds: Normal heart sounds.  Pulmonary:     Effort: Pulmonary effort is normal.     Breath sounds: Normal breath sounds.  Abdominal:     General: Bowel sounds are normal.     Palpations: Abdomen is soft.  Musculoskeletal:        General: No tenderness or deformity. Normal range of motion.     Cervical back: Normal range of motion.  Lymphadenopathy:     Cervical: No cervical adenopathy.  Skin:    General: Skin is warm and dry.     Findings: No erythema or rash.  Neurological:     Mental Status: She is alert and oriented to person, place, and time.  Psychiatric:        Behavior: Behavior normal.        Thought Content: Thought content normal.        Judgment: Judgment normal.      Lab Results  Component Value Date   WBC 9.2 12/16/2023   HGB 13.9 12/16/2023   HCT 41.6 12/16/2023   MCV 92.7 12/16/2023   PLT 282 12/16/2023   No results found for: FERRITIN, IRON, TIBC, UIBC, IRONPCTSAT Lab Results  Component Value Date   RBC 4.49 12/16/2023   No results found for: KPAFRELGTCHN, LAMBDASER, KAPLAMBRATIO No results found for: IGGSERUM, IGA, IGMSERUM No results found for: STEPHANY CARLOTA BENSON MARKEL EARLA JOANNIE DOC VICK, SPEI   Chemistry      Component Value Date/Time   NA 137 12/16/2023 1148   NA 143 11/02/2016 1335   NA 141 08/30/2015 1149   K 4.7 12/16/2023 1148   K 4.4 11/02/2016 1335   K 4.2 08/30/2015 1149   CL 100 12/16/2023 1148   CL 104  11/02/2016 1335   CO2 25 12/16/2023 1148   CO2 27 11/02/2016 1335   CO2 27 08/30/2015 1149   BUN 13 12/16/2023 1148   BUN 11 11/02/2016 1335   BUN 16.1 08/30/2015 1149   CREATININE 0.65 12/16/2023 1148   CREATININE 0.9 11/02/2016 1335   CREATININE 0.6 08/30/2015 1149      Component Value Date/Time   CALCIUM 9.2 12/16/2023 1148   CALCIUM 9.2 11/02/2016 1335   CALCIUM  9.3 08/30/2015 1149   ALKPHOS 104 12/16/2023 1148   ALKPHOS 94 (H) 11/02/2016 1335   ALKPHOS 110 08/30/2015 1149   AST 21 12/16/2023 1148   AST 26 08/30/2015 1149   ALT 19 12/16/2023 1148   ALT 25 11/02/2016 1335   ALT 33 08/30/2015 1149   BILITOT 0.4 12/16/2023 1148   BILITOT 0.55 08/30/2015 1149       Impression and Plan: Ms. Rogala is a pleasant postmenopausal 78 yo caucasian female with history of stage I lobular carcinoma of the left breast treated with lumpectomy in 2004 and most recently infiltrating ductal carcinoma of the right breast, ER/PR +/HER2 - .   She had her lumpectomy on 03/02/2022.   She completed her radiation therapy.  She did well with this.  We  now have her on Femara .  As always, we will plan to get her back in May.  We will see her back after she comes back from the [place].   Maude JONELLE Crease, MD 12/4/202512:47 PM

## 2023-12-17 LAB — CANCER ANTIGEN 27.29: CA 27.29: 19 U/mL (ref 0.0–38.6)

## 2023-12-27 DIAGNOSIS — H40013 Open angle with borderline findings, low risk, bilateral: Secondary | ICD-10-CM | POA: Diagnosis not present

## 2024-06-02 ENCOUNTER — Inpatient Hospital Stay: Admitting: Hematology & Oncology

## 2024-06-02 ENCOUNTER — Inpatient Hospital Stay
